# Patient Record
Sex: Female | Born: 1995
Health system: Southern US, Community
[De-identification: ages and names within clinical notes are randomized; demographics above are authoritative.]

## PROBLEM LIST (undated history)

## (undated) DIAGNOSIS — F419 Anxiety disorder, unspecified: Secondary | ICD-10-CM

## (undated) DIAGNOSIS — F32A Depression, unspecified: Secondary | ICD-10-CM

## (undated) DIAGNOSIS — R51 Headache: Secondary | ICD-10-CM

## (undated) DIAGNOSIS — J45909 Unspecified asthma, uncomplicated: Secondary | ICD-10-CM

## (undated) DIAGNOSIS — E282 Polycystic ovarian syndrome: Secondary | ICD-10-CM

## (undated) DIAGNOSIS — R519 Headache, unspecified: Secondary | ICD-10-CM

## (undated) DIAGNOSIS — J309 Allergic rhinitis, unspecified: Secondary | ICD-10-CM

## (undated) DIAGNOSIS — E559 Vitamin D deficiency, unspecified: Secondary | ICD-10-CM

## (undated) HISTORY — DX: Unspecified asthma, uncomplicated: J45.909

## (undated) HISTORY — DX: Polycystic ovarian syndrome: E28.2

## (undated) HISTORY — DX: Headache, unspecified: R51.9

## (undated) HISTORY — DX: Vitamin D deficiency, unspecified: E55.9

## (undated) HISTORY — DX: Depression, unspecified: F32.A

## (undated) HISTORY — DX: Allergic rhinitis, unspecified: J30.9

## (undated) HISTORY — PX: WISDOM TOOTH EXTRACTION: SHX21

## (undated) HISTORY — PX: NO PAST SURGERIES: SHX2092

## (undated) HISTORY — DX: Anxiety disorder, unspecified: F41.9

## (undated) HISTORY — DX: Headache: R51

---
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
## 2017-01-10 ENCOUNTER — Ambulatory Visit: Payer: Medicaid Other | Attending: Pediatrics | Admitting: Physical Therapy

## 2017-01-10 ENCOUNTER — Encounter: Payer: Self-pay | Admitting: Physical Therapy

## 2017-01-10 DIAGNOSIS — G8929 Other chronic pain: Secondary | ICD-10-CM | POA: Diagnosis present

## 2017-01-10 DIAGNOSIS — M25361 Other instability, right knee: Secondary | ICD-10-CM | POA: Diagnosis present

## 2017-01-10 DIAGNOSIS — M6281 Muscle weakness (generalized): Secondary | ICD-10-CM | POA: Diagnosis present

## 2017-01-10 DIAGNOSIS — M25561 Pain in right knee: Secondary | ICD-10-CM | POA: Diagnosis present

## 2017-01-10 NOTE — Therapy (Addendum)
[place], [place], [address] Phone: [phone]   Fax:  [phone]  Physical Therapy Evaluation  Patient Details  Name: Kristin Blanchard MRN: [id number] Date of Birth: [birth year] Referring Provider: Jonathon Resides, FNP   Encounter Date: 01/10/2017      PT End of Session - 01/10/17 1315    Visit Number 1   Number of Visits 9   Date for PT Re-Evaluation 03/07/17   PT Start Time 3267   PT Stop Time 1227   PT Time Calculation (min) 42 min   Activity Tolerance Patient tolerated treatment well   Behavior During Therapy [facility] for tasks assessed/performed      Past Medical History:  Diagnosis Date  . Allergic rhinitis   . Asthma     History reviewed. No pertinent surgical history.  There were no vitals filed for this visit.       Subjective Assessment - 01/10/17 1144    Subjective She reports a couple of months ago she felt her knee popped out of place.  It has done that 2 times.  Pain only the first time.  Once it happens, she has numbness below the knee. She often feels like her knee may pop out of place.  Denies swelling and weakness or difficulty walking or climbing stairs.  It happens randomly. Not interfering with activity. Has been doing a couple exercises her peeidatirican gave her.  Wears brace daily when she remembers (sleeve)    Pertinent History none    Limitations Other (comment)  Squatting down to pick up things from the floor, getting out of the car    Diagnostic tests XR neg    Patient Stated Goals Pt would like her knee to be better.     Currently in Pain? No/denies   Pain Location Knee   Pain Orientation Right   Pain Descriptors / Indicators Numbness   Pain Type Chronic pain   Pain Onset More than a month ago   Pain Frequency Several days a week   Aggravating Factors  random things, but squatting   Pain Relieving Factors no pain, numbness resolves with time.  Brace makes her feel  more stable.    Effect of Pain on Daily Activities lacks confidence , nervous about it dislocating again             [facility] PT Assessment - 01/10/17 0001      Assessment   Medical Diagnosis Patellar malalignment Rt. LE    Referring Provider Jonathon Resides, FNP    Onset Date/Surgical Date --  3 mos   Prior Therapy no      Precautions   Precautions None   Precaution Comments wears a PSO most days of the week      Balance Screen   Has the patient fallen in the past 6 months No     [place] residence   Living Arrangements Parent;Other relatives   Additional Comments plans to move into the dorms at [employer] with siblings, not very active, no time for fun      Cognition   Overall Cognitive Status Within Functional Limits for tasks assessed     Observation/Other Assessments   Focus on Therapeutic Outcomes (FOTO)  31%     Sensation   Light Touch Impaired by gross assessment   Additional Comments  reports entire lower leg goes numb after a dislocation of the patella      Functional Tests   Functional tests Squat;Single leg stance     Squat   Comments poor form , heels up     Single Leg Stance   Comments decr Rt. LE , mild Trendelenburg     Posture/Postural Control   Posture Comments slight genu recurvatum and valgus     AROM   Right Knee Extension 2   Right Knee Flexion 126   Left Knee Extension 0   Left Knee Flexion 130     Strength   Right Hip Flexion 4-/5   Right Hip ABduction 3/5   Left Hip Flexion 4/5   Left Hip ABduction 4/5   Right Knee Flexion 4/5   Right Knee Extension 4-/5   Left Knee Flexion 5/5   Left Knee Extension 5/5     Palpation   Patella mobility no pain    Palpation comment non tender joint line , no crepitus      Special Tests   Knee Special tests  Patellofemoral Grind Test (Clarke's Sign)     Patellofemoral  Grind test (Clark's Sign)   Findings Negative   Side  Right   Comments mild discomfort and with quad sets without compression     Ambulation/Gait   Gait Pattern Lateral trunk lean to right                  PT Education - 01/10/17 1312    Education provided Yes   Education Details PT/POC, HEP, explanation of condition, patellar tracking, anatomy   Person(s) Educated Patient   Methods Explanation   Comprehension Verbalized understanding;Returned demonstration          PT Short Term Goals - 01/10/17 1324      PT SHORT TERM GOAL #1   Title Pt will be I with HEP by first visit    Baseline given on eval    Time 2   Period Weeks   Status New   Target Date 01/21/17     PT SHORT TERM GOAL #2   Title Pt will be able to improve SLS on Rt. LE to 30 sec (equal to that of L )    Baseline Rt. leg 15 sec more effort needed   Time 4   Period Weeks   Status New           PT Long Term Goals - 01/10/17 1325      PT LONG TERM GOAL #1   Title Pt will be able to go without brace and report no instability most days of the week with normal ADLs   Baseline wears it most of the time for ADLs   Time 8   Period Weeks   Status New   Target Date 03/07/17     PT LONG TERM GOAL #2   Title Pt will be able to demo strength in Rt. LE to 4+/5 or more in abduction (hip) and knee ext/flexion to support gait and normal mechanics    Baseline 4-/5 to 4/5    Time 8   Period Weeks   Status New   Target Date 03/07/17     PT LONG TERM GOAL #3   Title Pt will be able to squat without discomfort for retrieving items off the floor, exercise.    Baseline uncomfortable, avoids this activity    Time 8   Period Weeks   Status New  Target Date 03/07/17                Plan - 01/10/17 1316    Clinical Impression Statement Pt presents for low complexity eval of ongoing knee instability which interferes with mobility.  She has weakness in Rt. hip abd, bilateral glutes and Rt. quads.   She has min to no pain most of the time, but when she squats or makes a sudden or random move her patella subluxes.  When is has dislocated she has numbness in her entire lower leg/foot which eventually resolves.  She would like to prevent further injury, get stronger and be able to return to school with more confidence in her Rt. knee. She may be able to come back on Fridays to do her therapy, but may also consider doing PT near [facility] She was given extensive HEP in the meantime for her to work on to maximize strength.    Clinical Presentation Stable   Clinical Decision Making Low   Rehab Potential Excellent   PT Frequency 1x / week   PT Duration 8 weeks   PT Next Visit Plan did tape help? check full HEP and cont VMO strength, core    PT Home Exercise Plan open chain hip and knee strength, supine hamstring and ITB stretching    Consulted and Agree with Plan of Care Patient      Patient will benefit from skilled therapeutic intervention in order to improve the following deficits and impairments:  Pain, Postural dysfunction, Decreased strength, Decreased endurance, Decreased balance, Obesity  Visit Diagnosis: Chronic pain of right knee  Muscle weakness (generalized)  Patellar instability of right knee     Problem List Patient Active Problem List   Diagnosis Date Noted  . Patellar pain, right 12/10/2016  . Irregular menses 05/07/2016  . Nonintractable headache 05/07/2016  . Vitamin D deficiency 08/24/2015  . Obesity 10/22/2014  . Asthma, chronic 02/09/2014  . Environmental allergies 02/09/2014    Azhar Knope 01/10/2017, 2:48 PM  [facility] [address] [place], [place], [address] Phone: [phone]   Fax:  [phone]  Name: Kristin Blanchard MRN: [id number] Date of Birth: Mar 29, 1996   Raeford Razor, PT 01/10/17 2:49 PM Phone: [phone] Fax: [phone]  PHYSICAL THERAPY DISCHARGE SUMMARY  Visits from Start of  Care: 1  Current functional level related to goals / functional outcomes: See above for most recent   Remaining deficits: Unknown did not return    Education / Equipment: HEP, LE alignment  Plan: Patient agrees to discharge.  Patient goals were not met. Patient is being discharged due to not returning since the last visit.  ?????     Raeford Razor, PT 03/01/17 7:47 AM Phone: [phone] Fax: [phone]

## 2017-01-10 NOTE — Patient Instructions (Signed)
Access Code: [email or web address]  URL: [email or web address]  Date: 01/10/2017  Prepared by: Claris GowerVince Carlson   Exercises  Seated Long Arc Quad - 10 reps - 3 sets - 1x daily - 7x weekly  Supine Active Straight Leg Raise - 10 reps - 3 sets - 1x daily - 7x weekly  Supine Short Arc Quad - 10 reps - 3 sets - 1x daily - 7x weekly  Sidelying Hip Adduction - 10 reps - 3 sets - 1x daily - 7x weekly  Sidelying Diagonal Hip Abduction - 10 reps - 3 sets - 1x daily - 7x weekly  Seated Quad Set - 10 reps - 3 sets - 1x daily - 7x weekly  Supine Hamstring Stretch with Strap - 3 reps - 1 sets - 30 hold - 1x daily - 7x weekly  Supine ITB Stretch with Strap - 3 reps - 1 sets - 30 hold - 1x daily - 7x weekly

## 2017-01-11 ENCOUNTER — Ambulatory Visit: Payer: Medicaid Other | Admitting: Physical Therapy

## 2017-01-21 ENCOUNTER — Ambulatory Visit (INDEPENDENT_AMBULATORY_CARE_PROVIDER_SITE_OTHER): Payer: Medicaid Other | Admitting: Clinical

## 2017-01-21 DIAGNOSIS — F4322 Adjustment disorder with anxiety: Secondary | ICD-10-CM

## 2017-01-21 NOTE — BH Specialist Note (Signed)
Integrated Behavioral Health Follow up Visit  MRN: [id number] Name: Kristin Blanchard   Session Start time: 9:01 AM Session End time: 1001 AM  Total time: 1 hour [facility] Visits: 6th  Type of Service: Integrated Behavioral Health- Individual/Family Interpretor:No. Interpretor Name and Language: n/a   SUBJECTIVE: Kristin Blanchard is a 21 y.o. female accompanied by patient. Patient was referred by C. Millican for social anxiety.  Patient reports the following symptoms/concerns:   1 Still feeling anxious around public speaking which has kept her from attending classes in college in the past year 2. Difficulty sleeping - Has been taking melatonin recently 3. Reported today increased anxiety due to death of a friend in her community  Duration of problem: Months; Severity of problem: moderate  OBJECTIVE: Mood: Euthymic and Affect: Appropriate Risk of harm to self or others: No plan to harm self or others   LIFE CONTEXT: Family and Social: Lives with parents School/Work: Going back to college to [employer], classes starting this Wednesday   Self-Care: Enjoys being with her friends in [place] Life Changes: Adjusting to college, adjusting to recent death of a friend  GOALS ADDRESSED: Patient will reduce symptoms of: anxiety around public speaking and increase knowledge and/or ability of: coping skills and stress reduction.  INTERVENTIONS: Assessed sleep hygiene   Brief CBT  Standardized Assessments completed: None at this time   ASSESSMENT: Kristin Blanchard presented to be anxious today due to the death of a friend over the weekend.  She plans on transitioning to college this week.  She's been taking Metformin once a day for PCOS.  Pt reported ongoing concerns with speaking in front of others and difficulty sleeping.  Although she's decreased her caffeine intake the past week, her anxiety has increased and has disrupted her sleep.  Kristin Blanchard actively participated in challenging unhelpful thoughts  causing her anxiety.   PLAN: 1. Follow up with behavioral health clinician on : 03/07/17 Joint visit with FNP & RD 2. Behavioral recommendations:   * Practice challenging unhelpful thoughts & replacing it with more positive ones. * Call [facility] about what they need to transfer care to them for physical therapy (Classes start Wed) * Follow up with student services for counseling on campus  3. Referral(s): Integrated Behavioral Health Services (In Clinic) 4. "From scale of 1-10, how likely are you to follow plan?": She was agreeable to plan above   Plan for next visit: Review results of Connor's Self-Report Ensure adequate support system in place at college Practice cognitive coping skills  Gordy Savers, LCSW

## 2017-01-25 ENCOUNTER — Ambulatory Visit: Payer: Medicaid Other | Admitting: Physical Therapy

## 2017-02-01 ENCOUNTER — Ambulatory Visit: Payer: Medicaid Other | Admitting: Internal Medicine

## 2017-02-08 ENCOUNTER — Encounter: Payer: Medicaid Other | Admitting: Physical Therapy

## 2017-02-15 ENCOUNTER — Ambulatory Visit: Payer: Medicaid Other | Attending: Pediatrics | Admitting: Physical Therapy

## 2017-02-22 ENCOUNTER — Encounter: Payer: Self-pay | Admitting: Physician Assistant

## 2017-02-22 ENCOUNTER — Ambulatory Visit (INDEPENDENT_AMBULATORY_CARE_PROVIDER_SITE_OTHER): Payer: Medicaid Other | Admitting: Physician Assistant

## 2017-02-22 VITALS — BP 104/72 | HR 68 | Ht 60.0 in | Wt 210.4 lb

## 2017-02-22 DIAGNOSIS — R112 Nausea with vomiting, unspecified: Secondary | ICD-10-CM | POA: Diagnosis not present

## 2017-02-22 MED ORDER — ONDANSETRON 4 MG PO TBDP: ORAL_TABLET | ORAL | 1 refills | Status: DC

## 2017-02-22 NOTE — Progress Notes (Signed)
Reviewed and agree with initial management plan.  Bassel Gaskill T. Royelle Hinchman, MD FACG 

## 2017-02-22 NOTE — Progress Notes (Signed)
Subjective:    Patient ID: Kristin Blanchard, female    DOB: 12-21-1995, 21 y.o.   MRN: [id number]  HPI Kristin Blanchard is a 21 year old female, new to GI today referred by Mills Koller NP/[facility]. Patient has history of asthma, obesity and polycystic ovarian syndrome as well as vitamin D deficiency. She comes in today with complaint of intermittent nausea with vomiting which is been going on over the past 10 years or so. She has not had any prior GI evaluation that she can recall. She recently was given a trial of Protonix with no change in symptoms. Patient states she remembers having symptoms as young as age 13. She says when she was a child she would have symptoms almost every morning immediately on waking up. She would feel slightly nauseated and that she tried to eat she would vomit and then feel fine. She would generally avoid eating for a few hours, this sensation would subside and she would be fine the rest of the day.  Over the past few years she says that she has symptoms less often.. She may go for a couple of weeks without any episodes and then may have symptoms for one or 2 days in a row. She says it still present early in the a.m. when she wakes up. If she feels nauseated,she waits for a few hours before eating anything and then she'll be fine. If she tries to eat while she feels slightly nauseated she'll vomit and then feel fine. She does not feel that symptoms are directly stress related. She has no complaints of associated abdominal pain. She feels fine in between these episodes and generally feels fine each day after she vomits even if she has an episode. Appetite fine, no issues with her bowels, no heartburn or indigestion though  it sounds like she may occasionally have some sour brash. Weight has been stable.  Review of Systems Pertinent positive and negative review of systems were noted in the above HPI section.  All other review of systems was otherwise  negative.  Outpatient Encounter Prescriptions as of 02/22/2017  Medication Sig  . albuterol (PROVENTIL HFA;VENTOLIN HFA) 108 (90 Base) MCG/ACT inhaler Inhale 2 puffs into the lungs every 4 hours as needed to treat wheezes, cough, shortness of breath  . cetirizine (ZYRTEC) 10 MG tablet Take 1 tablet (10 mg total) by mouth daily.  . metFORMIN (GLUCOPHAGE) 500 MG tablet Take by mouth 2 (two) times daily with a meal.  . ondansetron (ZOFRAN ODT) 4 MG disintegrating tablet Take 1 tab, dissolve on the tongue in the morning for nausea.  . [DISCONTINUED] pantoprazole (PROTONIX) 40 MG tablet Take 1 tablet (40 mg total) by mouth daily. (Patient not taking: Reported on 02/22/2017)   No facility-administered encounter medications on file as of 02/22/2017.    No Known Allergies Patient Active Problem List   Diagnosis Date Noted  . Patellar pain, right 12/10/2016  . Irregular menses 05/07/2016  . Nonintractable headache 05/07/2016  . Vitamin D deficiency 08/24/2015  . Obesity 10/22/2014  . Asthma, chronic 02/09/2014  . Environmental allergies 02/09/2014   Social History   Social History  . Marital status: Single    Spouse name: N/A  . Number of children: 0  . Years of education: N/A   Occupational History  . student    Social History Main Topics  . Smoking status: Never Smoker  . Smokeless tobacco: Never Used  . Alcohol use No  . Drug use: No  .  Sexual activity: No   Other Topics Concern  . Not on file   Social History Narrative  . No narrative on file    Ms. Petkus family history includes Diabetes in her father, paternal aunt, and paternal uncle; Hypertension in her father; Kidney disease in her maternal aunt; Other in her mother.      Objective:    Vitals:   02/22/17 1107  BP: 104/72  Pulse: 68    Physical Exam  well-developed young female in no acute distress, blood pressure 104/72, pulse 68, height 5 foot, weight 210, BMI 41.0. HEENT; nontraumatic normocephalic EOMI  PERRLA sclera anicteric, Cardiovascular; regular rate and rhythm with S1-S2 no murmur or gallop, Pulmonary ;clear bilaterally, Abdomen; obese soft and tender nondistended bowel sounds are active there is no palpable mass or hepatosplenomegaly, Rectal ;exam not done, Extremities ;no clubbing cyanosis or edema skin warm and dry, Neuropsych; mood and affect appropriate       Assessment & Plan:   #87 21 year old female with at least 10 year history of intermittent early morning mild nausea followed by 1 episode of emesis if she attempts to eat while feeling queasy. Symptoms generally subside within 1-2 hours of awakening, or immediately after vomiting. Patient states that these episodes  Used  to occur every day and as she has gotten older seem to be occurring less frequently and may go weeks at a time without symptoms. Etiology of symptoms is not clear, suspect functional and doubt any significant underlying pathology. We will rule out gallbladder disease though doubt, rule out GERD, chronic gastropathy. #2 morbid obesity with BMI 41 #3 anxiety #4 asthma #5 polycystic ovarian syndrome  Plan; Will give patient a trial of Zofran 4 mg ODT to be used on a when necessary basis at onset of nausea Schedule upper abdominal ultrasound  schedule barium swallow/upper GI Patient will be established with Dr. Russella Dar, and we will arrange follow-up depending on findings of above.  Naia Ruff S Teesha Ohm PA-C 02/22/2017   Cc: Christianne Dolin, NP

## 2017-02-22 NOTE — Patient Instructions (Addendum)
We sent a prescription for zofran ODT for nausea to your pharmacy. You have been scheduled for an abdominal ultrasound at [facility] (1st floor of hospital) on Friday 10-5  at 9:00  am. Please arrive at 8:45 am minutes  to your appointment for registration. Make certain not to have anything to eat or drink after midnight  prior to your appointment. Should you need to reschedule your appointment, please contact radiology at [phone]. This test typically takes about 30 minutes to perform.  The Barium swallow test and the Upper Gi test would be done at 10:30, arrive at 10:15 am.   __________________________________________________________________ A barium swallow is an examination that concentrates on views of the esophagus. This tends to be a double contrast exam (barium and two liquids which, when combined, create a gas to distend the wall of the oesophagus) or single contrast (non-ionic iodine based). The study is usually tailored to your symptoms so a good history is essential. Attention is paid during the study to the form, structure and configuration of the esophagus, looking for functional disorders (such as aspiration, dysphagia, achalasia, motility and reflux) EXAMINATION You may be asked to change into a gown, depending on the type of swallow being performed. A radiologist and radiographer will perform the procedure. The radiologist will advise you of the type of contrast selected for your procedure and direct you during the exam. You will be asked to stand, sit or lie in several different positions and to hold a small amount of fluid in your mouth before being asked to swallow while the imaging is performed .In some instances you may be asked to swallow barium coated marshmallows to assess the motility of a solid food bolus. The exam can be recorded as a digital or video fluoroscopy procedure. POST PROCEDURE It will take 1-2 days for the barium to pass through your system. To  facilitate this, it is important, unless otherwise directed, to increase your fluids for the next 24-48hrs and to resume your normal diet.  This test typically takes about 30 minutes to perform. __________________________________________________________________________________  __________________________________________________________________ A barium swallow is an examination that concentrates on views of the esophagus. This tends to be a double contrast exam (barium and two liquids which, when combined, create a gas to distend the wall of the oesophagus) or single contrast (non-ionic iodine based). The study is usually tailored to your symptoms so a good history is essential. Attention is paid during the study to the form, structure and configuration of the esophagus, looking for functional disorders (such as aspiration, dysphagia, achalasia, motility and reflux) EXAMINATION You may be asked to change into a gown, depending on the type of swallow being performed. A radiologist and radiographer will perform the procedure. The radiologist will advise you of the type of contrast selected for your procedure and direct you during the exam. You will be asked to stand, sit or lie in several different positions and to hold a small amount of fluid in your mouth before being asked to swallow while the imaging is performed .In some instances you may be asked to swallow barium coated marshmallows to assess the motility of a solid food bolus. The exam can be recorded as a digital or video fluoroscopy procedure. POST PROCEDURE It will take 1-2 days for the barium to pass through your system. To facilitate this, it is important, unless otherwise directed, to increase your fluids for the next 24-48hrs and to resume your normal diet.  This test typically takes about  30 minutes to  perform. _______________________________________________________________________ ________________________________________________________________ An upper GI series uses x rays to help diagnose problems of the upper GI tract, which includes the esophagus, stomach, and duodenum. The duodenum is the first part of the small intestine. An upper GI series is conducted by a radiology technologist or a radiologist-a doctor who specializes in x-ray imaging-at a hospital or outpatient center. While sitting or standing in front of an x-ray machine, the patient drinks barium liquid, which is often white and has a chalky consistency and taste. The barium liquid coats the lining of the upper GI tract and makes signs of disease show up more clearly on x rays. X-ray video, called fluoroscopy, is used to view the barium liquid moving through the esophagus, stomach, and duodenum. Additional x rays and fluoroscopy are performed while the patient lies on an x-ray table. To fully coat the upper GI tract with barium liquid, the technologist or radiologist may press on the abdomen or ask the patient to change position. Patients hold still in various positions, allowing the technologist or radiologist to take x rays of the upper GI tract at different angles. If a technologist conducts the upper GI series, a radiologist will later examine the images to look for problems.  This test typically takes about 1 hour to complete. __________________________________________________________________

## 2017-03-01 ENCOUNTER — Ambulatory Visit: Payer: Self-pay | Admitting: Family

## 2017-03-01 ENCOUNTER — Ambulatory Visit (HOSPITAL_COMMUNITY)
Admission: RE | Admit: 2017-03-01 | Discharge: 2017-03-01 | Disposition: A | Discharge status: Home / Self Care | Payer: Medicaid Other | Source: Ambulatory Visit | Attending: Physician Assistant | Admitting: Physician Assistant

## 2017-03-01 DIAGNOSIS — R112 Nausea with vomiting, unspecified: Secondary | ICD-10-CM

## 2017-03-07 ENCOUNTER — Encounter: Payer: Self-pay | Admitting: Family

## 2017-03-07 ENCOUNTER — Ambulatory Visit (INDEPENDENT_AMBULATORY_CARE_PROVIDER_SITE_OTHER): Payer: BLUE CROSS/BLUE SHIELD | Admitting: Family

## 2017-03-07 ENCOUNTER — Encounter: Payer: Medicaid Other | Attending: Pediatrics | Admitting: *Deleted

## 2017-03-07 ENCOUNTER — Ambulatory Visit (INDEPENDENT_AMBULATORY_CARE_PROVIDER_SITE_OTHER): Payer: BLUE CROSS/BLUE SHIELD | Admitting: Clinical

## 2017-03-07 VITALS — BP 112/77 | HR 73 | Ht 59.65 in | Wt 209.2 lb

## 2017-03-07 DIAGNOSIS — F4322 Adjustment disorder with anxiety: Secondary | ICD-10-CM | POA: Diagnosis not present

## 2017-03-07 DIAGNOSIS — E282 Polycystic ovarian syndrome: Secondary | ICD-10-CM | POA: Diagnosis not present

## 2017-03-07 DIAGNOSIS — F4323 Adjustment disorder with mixed anxiety and depressed mood: Secondary | ICD-10-CM

## 2017-03-07 DIAGNOSIS — E559 Vitamin D deficiency, unspecified: Secondary | ICD-10-CM

## 2017-03-07 DIAGNOSIS — Z713 Dietary counseling and surveillance: Secondary | ICD-10-CM | POA: Diagnosis not present

## 2017-03-07 MED ORDER — FLUOXETINE HCL 20 MG PO CAPS
20.0000 mg | ORAL_CAPSULE | Freq: Every day | ORAL | 0 refills | Status: DC
Start: 2017-03-07 — End: 2017-04-05

## 2017-03-07 MED ORDER — HYDROXYZINE PAMOATE 25 MG PO CAPS: 25.0000 mg | ORAL_CAPSULE | Freq: Three times a day (TID) | ORAL | 0 refills | Status: DC | PRN

## 2017-03-07 NOTE — Patient Instructions (Signed)
Start taking Prozac 20 mg dailly in the morning.  Hydroxzyine 25 mg TID as needed.  Return in 3 weeks for med change.

## 2017-03-07 NOTE — Progress Notes (Signed)
THIS RECORD MAY CONTAIN CONFIDENTIAL INFORMATION THAT SHOULD NOT BE RELEASED WITHOUT REVIEW OF THE SERVICE PROVIDER.  Adolescent Medicine Consultation Follow-Up Visit Kristin Blanchard  is a 21 y.o. female referred by Tilman Neat, MD here today for follow-up regarding PCOS.    Last seen in Adolescent Medicine Clinic on 12/31/16 for same.  Plan at last visit included protonix for GERD-like symptoms; zyrtec for allergies/sleep, Neuro referral, metformin cont'd for PCOS.   Pertinent Labs? No Growth Chart Viewed? no   History was provided by the patient.  Interpreter? no  PCP Confirmed?  no  My Chart Activated?   yes    Chief Complaint  Patient presents with  . Follow-up    not sleeping well--needs results    HPI:    -upcoming GI procedure. Notes reviewed and confirmed with pt.  -symptoms persists  -having anxiety; no ready to pursue medication.  -school stressors, sleep disturbed.  -safe to herself.   Review of Systems  Constitutional: Negative for malaise/fatigue.  Eyes: Negative for double vision.  Respiratory: Negative for shortness of breath.   Cardiovascular: Negative for chest pain and palpitations.  Gastrointestinal: Positive for nausea. Negative for abdominal pain, constipation, diarrhea and vomiting.  Genitourinary: Negative for dysuria.  Musculoskeletal: Negative for joint pain and myalgias.  Skin: Negative for rash.  Neurological: Positive for headaches. Negative for dizziness.  Endo/Heme/Allergies: Does not bruise/bleed easily.   PHQ-SADS SCORE ONLY 03/15/2017 12/31/2016  PHQ-15 3 5   GAD-7 7 3   PHQ-9 7 6   Suicidal Ideation No No  Comment somewhat difficult  "Somewhat difficult"   PHQ-SADS SCORE ONLY 11/29/2016  PHQ-15 5  GAD-7 6  PHQ-9 8  Suicidal Ideation No  Comment "Very difficult" to complete ADL, no anxiety attacks    Patient's last menstrual period was 03/01/2017. No Known Allergies Outpatient Medications Prior to Visit  Medication Sig  Dispense Refill  . albuterol (PROVENTIL HFA;VENTOLIN HFA) 108 (90 Base) MCG/ACT inhaler Inhale 2 puffs into the lungs every 4 hours as needed to treat wheezes, cough, shortness of breath 1 Inhaler 1  . cetirizine (ZYRTEC) 10 MG tablet Take 1 tablet (10 mg total) by mouth daily. 30 tablet 6  . ondansetron (ZOFRAN ODT) 4 MG disintegrating tablet Take 1 tab, dissolve on the tongue in the morning for nausea. 30 tablet 1  . metFORMIN (GLUCOPHAGE) 500 MG tablet Take by mouth 2 (two) times daily with a meal.     No facility-administered medications prior to visit.      Patient Active Problem List   Diagnosis Date Noted  . Patellar pain, right 12/10/2016  . Irregular menses 05/07/2016  . Nonintractable headache 05/07/2016  . Vitamin D deficiency 08/24/2015  . Obesity 10/22/2014  . Asthma, chronic 02/09/2014  . Environmental allergies 02/09/2014    Physical Exam:  Vitals:   03/07/17 1052  BP: 112/77  Pulse: 73  Weight: 209 lb 3.2 oz (94.9 kg)  Height: 4' 11.65" (1.515 m)   BP 112/77 (BP Location: Right Arm, Patient Position: Sitting, Cuff Size: Large)   Pulse 73   Ht 4' 11.65" (1.515 m)   Wt 209 lb 3.2 oz (94.9 kg)   LMP 03/01/2017   BMI 41.34 kg/m  Body mass index: body mass index is 41.34 kg/m. Blood pressure percentiles are 63 % systolic and 93 % diastolic based on the August 2017 AAP Clinical Practice Guideline. Blood pressure percentile targets: 90: 123/76, 95: 127/79, 95 + 12 mmHg: 139/91.  Wt Readings from Last 3 Encounters:  03/07/17  209 lb 3.2 oz (94.9 kg) (98 %, Z= 2.08)*  02/22/17 210 lb 6 oz (95.4 kg) (98 %, Z= 2.09)*  12/31/16 208 lb (94.3 kg) (98 %, Z= 2.06)*   * Growth percentiles are based on CDC 2-20 Years data.    Physical Exam  Constitutional: She is oriented to person, place, and time. She appears well-developed. No distress.  HENT:  Head: Normocephalic and atraumatic.  Eyes: Pupils are equal, round, and reactive to light. EOM are normal. No scleral  icterus.  Neck: Normal range of motion. Neck supple. No thyromegaly present.  Cardiovascular: Normal rate, regular rhythm, normal heart sounds and intact distal pulses.   No murmur heard. Pulmonary/Chest: Effort normal and breath sounds normal.  Abdominal: Soft.  Musculoskeletal: Normal range of motion. She exhibits no edema.  Lymphadenopathy:    She has no cervical adenopathy.  Neurological: She is alert and oriented to person, place, and time. No cranial nerve deficit.  Skin: Skin is warm and dry. No rash noted.  Psychiatric: She has a normal mood and affect. Her behavior is normal. Judgment and thought content normal.    Assessment/Plan: 1. Adjustment disorder with mixed anxiety and depressed mood -start prozac 20 mg  -start hydroxyzine 25 TID as needed for breakthrough anxiety or sleep  -return in 3 weeks for med check  -BBW and return precautions improved  2. Vitamin D deficiency -needs redraw at next OV   BH screenings: phqsads reviewed and indicated anxiety. Screens discussed with patient and parent and adjustments to plan made accordingly.   Follow-up:  Return in about 3 weeks (around 03/28/2017) for with Christianne Dolin, FNP-C, medication follow-up.   Medical decision-making:  >25 minutes spent face to face with patient with more than 50% of appointment spent reviewing medication management, AEs, BBW, and return precautions. Patient would benefit from therapy; continue to encourage.

## 2017-03-07 NOTE — Progress Notes (Signed)
Medical Nutrition Therapy:  Appt start time: 1100 end time:  1030   Assessment:  Primary concerns today: Kristin Blanchard is here for nutrition counseling pertaining to PCOS.   Doing ok.  College is challenging.  Stomach is still upset..  Saw GI.  Was prescribed Zofran but left it at home and hasn't started taking it.  Still waiting results of abdominal ultrasound.  barium swallow was fine.   States she has been trying to eat more.  Gets breakfast at cafeteria.  Eats a late lunch and dinner on mondays and wednesdays.  Tuesdays breafast gets skipped and has lunch and dinner.  Has snacks when she gets hungry.  It's hard to find food she wants at the cafeteria.  Is getting tired of eating the same thing over and over.   Stopped taking Metformin because it was hard to eat enough food to stop GI distress.  Wants to switch to Ovasitol.    Not sleeping well.  More headaches.  Was prescribed contacts, but doesn't wear them every day.  . Takes naps.  Wonders if she is depressed?  .  Realizes her friend group is not for her    Preferred Learning Style:  No preference indicated   Learning Readiness:  Change in progress  DIETARY INTAKE:  24-hr recall:  B: yogurt parfait with fruit and granola.  Water Water throughout  Coffee [place] pesto panino from [employer].  Ate half 1/2 stuffed pepper Traditional sudanese dish with beef, flatbread Mango smoothie with tea Beverages: coffee, water  Usual physical activity:walks to class    Nutritional Diagnosis:  [place]-2.1 Inpaired nutrition utilization As related to carbohyrate.  As evidenced by PCOS.    Intervention:  Nutrition counseling provided.  Discussed 3 meals/day with adequate protein, wear contacts, take ovasitol, recommended omega-3.  Exercise, if able, fruits and veggies.  Discouraged dieting and pursuit of weight loss  Teaching Method Utilized:  Auditory   Handouts given during visit include:  PCOS Tips  What is inositol?  Barriers to  learning/adherence to lifestyle change: family  Demonstrated degree of understanding via:  Teach Back   Monitoring/Evaluation:  Dietary intake, exercise, labs, and body weight in 2 month(s).

## 2017-03-07 NOTE — BH Specialist Note (Signed)
Integrated Behavioral Health Follow up Visit  MRN: [id number] Name: Laquita Harlan   Session Start time: 1020 Session End time: 1045  Total time: 25 min [facility] Visits: 7th  Type of Service: Integrated Behavioral Health- Individual/Family Interpretor:No. Interpretor Name and Language: n/a   SUBJECTIVE: Kristin Blanchard is a 21 y.o. female accompanied by patient. Patient was referred by C. Millican for social anxiety.  Patient reports the following symptoms/concerns:   1. Anxious about meeting new people 2. Difficulty sleeping   Duration of problem: Months; Severity of problem: moderate  OBJECTIVE: Mood: Euthymic and Affect: Appropriate Risk of harm to self or others: No plan to harm self or others   LIFE CONTEXT: Family and Social: Lives with parents School/Work: 2nd year at [employer] Self-Care: Enjoys being with her friends in [place] Life Changes: Adjusting to college,   GOALS ADDRESSED: Patient will reduce symptoms of: anxiety around meeting new people and increase knowledge and/or ability of: coping skills and stress reduction.  INTERVENTIONS: Assessed sleep hygiene   Brief CBT  Standardized Assessments completed: None at this time (reviewed results of Connor's self-report that were not significant for ADHD symptoms)   ASSESSMENT: Kamariyah presented to be stressed from exams at school last week and interacting with peers.  Dewana actively participated in challenging unhelpful thoughts causing her anxiety.  Shikita was able to identify her accomplishments in meeting new people the past few weeks.   PLAN: 1. Follow up with behavioral health clinician on : As needed during visits with medical provider 2. Behavioral recommendations:   * Practice challenging unhelpful thoughts & replacing it with more positive ones.  * Follow up with student services for counseling on campus as needed  3. Referral(s): Integrated [employer] (In Clinic) 4. "From scale of  1-10, how likely are you to follow plan?": She was agreeable to plan above    Gordy Savers, LCSW

## 2017-03-08 ENCOUNTER — Other Ambulatory Visit (HOSPITAL_COMMUNITY): Payer: Medicaid Other

## 2017-03-08 ENCOUNTER — Ambulatory Visit (HOSPITAL_COMMUNITY): Payer: Medicaid Other

## 2017-03-10 ENCOUNTER — Encounter: Payer: Self-pay | Admitting: Family

## 2017-03-22 ENCOUNTER — Encounter: Payer: Self-pay | Admitting: Diagnostic Neuroimaging

## 2017-03-22 ENCOUNTER — Ambulatory Visit (INDEPENDENT_AMBULATORY_CARE_PROVIDER_SITE_OTHER): Payer: BLUE CROSS/BLUE SHIELD | Admitting: Diagnostic Neuroimaging

## 2017-03-22 VITALS — BP 111/82 | HR 68 | Ht 60.0 in | Wt 208.0 lb

## 2017-03-22 DIAGNOSIS — G43009 Migraine without aura, not intractable, without status migrainosus: Secondary | ICD-10-CM | POA: Diagnosis not present

## 2017-03-22 DIAGNOSIS — G4489 Other headache syndrome: Secondary | ICD-10-CM | POA: Diagnosis not present

## 2017-03-22 MED ORDER — TOPIRAMATE 50 MG PO TABS: 50.0000 mg | ORAL_TABLET | Freq: Two times a day (BID) | ORAL | 12 refills | Status: DC

## 2017-03-22 MED ORDER — RIZATRIPTAN BENZOATE 10 MG PO TBDP: 10.0000 mg | ORAL_TABLET | ORAL | 11 refills | Status: DC | PRN

## 2017-03-22 NOTE — Patient Instructions (Signed)
Thank you for coming to see [place] at [facility]. I hope we have been able to provide you high quality care today.  You may receive a patient satisfaction survey over the next few weeks. We would appreciate your feedback and comments so that we may continue to improve ourselves and the health of our patients.  - check MRI brain  - start topiramate '50mg'$  at bedtime; after 1 week increase to twice a day; drink plenty of water  - rizatriptan '10mg'$  as needed for breakthrough headache; may repeat x 1 after 2 hours; max 2 tabs per day or 8 per month  - To prevent or relieve headaches, try the following:   Cool Compress. Lie down and place a cool compress on your head.   Avoid headache triggers. If certain foods or odors seem to have triggered your migraines in the past, avoid them. A headache diary might help you identify triggers.   Include physical activity in your daily routine.   Manage stress. Find healthy ways to cope with the stressors, such as delegating tasks on your to-do list.   Practice relaxation techniques. Try deep breathing, yoga, massage and visualization.   Eat regularly. Eating regularly scheduled meals and maintaining a healthy diet might help prevent headaches. Also, drink plenty of fluids.   Follow a regular sleep schedule. Sleep deprivation might contribute to headaches  Consider biofeedback. With this mind-body technique, you learn to control certain bodily functions - such as muscle tension, heart rate and blood pressure - to prevent headaches or reduce headache pain.   ~~~~~~~~~~~~~~~~~~~~~~~~~~~~~~~~~~~~~~~~~~~~~~~~~~~~~~~~~~~~~~~~~  DR. Norah Fick'S GUIDE TO HAPPY AND HEALTHY LIVING These are some of my general health and wellness recommendations. Some of them may apply to you better than others. Please use common sense as you try these suggestions and feel free to ask me any questions.   ACTIVITY/FITNESS Mental, social, emotional and  physical stimulation are very important for brain and body health. Try learning a new activity (arts, music, language, sports, games).  Keep moving your body to the best of your abilities. You can do this at home, inside or outside, the park, community center, gym or anywhere you like. Consider a physical therapist or personal trainer to get started. Consider the app Sworkit. Fitness trackers such as smart-watches, smart-phones or Fitbits can help as well.   NUTRITION Eat more plants: colorful vegetables, nuts, seeds and berries.  Eat less sugar, salt, preservatives and processed foods.  Avoid toxins such as cigarettes and alcohol.  Drink water when you are thirsty. Warm water with a slice of lemon is an excellent morning drink to start the day.  Consider these websites for more information The Nutrition Source (https://www.henry-hernandez.biz/) Precision Nutrition (WindowBlog.ch)   RELAXATION Consider practicing mindfulness meditation or other relaxation techniques such as deep breathing, prayer, yoga, tai chi, massage. See website mindful.org or the apps Headspace or Calm to help get started.   SLEEP Try to get at least 7-8+ hours sleep per day. Regular exercise and reduced caffeine will help you sleep better. Practice good sleep hygeine techniques. See website sleep.org for more information.   PLANNING Prepare estate planning, living will, healthcare POA documents. Sometimes this is best planned with the help of an attorney. Theconversationproject.org and agingwithdignity.org are excellent resources.

## 2017-03-22 NOTE — Progress Notes (Signed)
[facility]  PATIENT: Kristin Blanchard DOB: 1995/10/15  REFERRING CLINICIAN: C hacker, NP HISTORY FROM: patient and chart  REASON FOR VISIT: new consult   HISTORICAL  CHIEF COMPLAINT:  Chief Complaint  Patient presents with  . Headache    rm 7, New Pt, "headaches for a long time, usually in my eyes; never seen a neurologist before"    HISTORY OF PRESENT ILLNESS:   21 year old female with history of asthma and PCO S, here for evaluation of headaches.  Patient has long history of headaches from second grade, with pain in her eyes, forehead, photophobia, nausea. She has bilateral pounding and sharp pain. Triggering factors include certain smells, especially things that remind her of her childhood. Headaches affect her quality of sleep. Patient having 1-4 headaches per week, lasting hours at a time. Patient has never officially been diagnosed with migraine headaches. No warning or aura symptoms. She takes over-the-counter medications such as Aleve without relief. Patient's mother has migraine headaches.    REVIEW OF SYSTEMS: Full 14 system review of systems performed and negative with exception of: Headache insomnia sleepiness anxiety allergies. Excessive daytime sleepiness.  ALLERGIES: No Known Allergies  HOME MEDICATIONS: Outpatient Medications Prior to Visit  Medication Sig Dispense Refill  . albuterol (PROVENTIL HFA;VENTOLIN HFA) 108 (90 Base) MCG/ACT inhaler Inhale 2 puffs into the lungs every 4 hours as needed to treat wheezes, cough, shortness of breath 1 Inhaler 1  . cetirizine (ZYRTEC) 10 MG tablet Take 1 tablet (10 mg total) by mouth daily. 30 tablet 6  . FLUoxetine (PROZAC) 20 MG capsule Take 1 capsule (20 mg total) by mouth daily. 30 capsule 0  . hydrOXYzine (VISTARIL) 25 MG capsule Take 1 capsule (25 mg total) by mouth 3 (three) times daily as needed. 30 capsule 0  . ondansetron (ZOFRAN ODT) 4 MG disintegrating tablet Take 1 tab, dissolve on the  tongue in the morning for nausea. 30 tablet 1  . metFORMIN (GLUCOPHAGE) 500 MG tablet Take by mouth 2 (two) times daily with a meal.     No facility-administered medications prior to visit.     PAST MEDICAL HISTORY: Past Medical History:  Diagnosis Date  . Allergic rhinitis   . Asthma   . Headache   . PCOS (polycystic ovarian syndrome)     PAST SURGICAL HISTORY: Past Surgical History:  Procedure Laterality Date  . NO PAST SURGERIES    . WISDOM TOOTH EXTRACTION      FAMILY HISTORY: Family History  Problem Relation Age of Onset  . Diabetes Father   . Hypertension Father   . Other Mother        hypoglycemia  . Diabetes Paternal Aunt   . Diabetes Paternal Uncle   . Kidney disease Maternal Aunt     SOCIAL HISTORY:  Social History   Social History  . Marital status: Single    Spouse name: N/A  . Number of children: 0  . Years of education: N/A   Occupational History  . student     [employer]   Social History Main Topics  . Smoking status: Never Smoker  . Smokeless tobacco: Never Used  . Alcohol use No  . Drug use: No  . Sexual activity: No   Other Topics Concern  . Not on file   Social History Narrative   03/22/17 [place], [employer], sophomore   Caffeine- coffee, 1 cup 3 x weekly     PHYSICAL EXAM  GENERAL EXAM/CONSTITUTIONAL: Vitals:  Vitals:  03/22/17 0834  BP: 111/82  Pulse: 68  Weight: 208 lb (94.3 kg)  Height: 5' (1.524 m)     Body mass index is 40.62 kg/m.  Visual Acuity Screening   Right eye Left eye Both eyes  Without correction:     With correction: 20/40 20/40   Comments: contacts    Patient is in no distress; well developed, nourished and groomed; neck is supple  CARDIOVASCULAR:  Examination of carotid arteries is normal; no carotid bruits  Regular rate and rhythm, no murmurs  Examination of peripheral vascular system by observation and palpation is normal  EYES:  Ophthalmoscopic exam of optic discs and  posterior segments is normal; no papilledema or hemorrhages  MUSCULOSKELETAL:  Gait, strength, tone, movements noted in Neurologic exam below  NEUROLOGIC: MENTAL STATUS:  No flowsheet data found.  awake, alert, oriented to person, place and time  recent and remote memory intact  normal attention and concentration  language fluent, comprehension intact, naming intact,   fund of knowledge appropriate  CRANIAL NERVE:   2nd - no papilledema on fundoscopic exam  2nd, 3rd, 4th, 6th - pupils equal and reactive to light, visual fields full to confrontation, extraocular muscles intact, no nystagmus  5th - facial sensation symmetric  7th - facial strength symmetric  8th - hearing intact  9th - palate elevates symmetrically, uvula midline  11th - shoulder shrug symmetric  12th - tongue protrusion midline  MOTOR:   normal bulk and tone, full strength in the BUE, BLE  SENSORY:   normal and symmetric to light touch, temperature, vibration  COORDINATION:   finger-nose-finger, fine finger movements normal  REFLEXES:   deep tendon reflexes present and symmetric  GAIT/STATION:   narrow based gait; able to walk tandem; romberg is negative    DIAGNOSTIC DATA (LABS, IMAGING, TESTING) - I reviewed patient records, labs, notes, testing and imaging myself where available.  Lab Results  Component Value Date   WBC 5.8 03/09/2016   HGB 12.4 03/09/2016   HCT 38.0 03/09/2016   MCV 88.4 03/09/2016   PLT 338 03/09/2016      Component Value Date/Time   NA 139 05/07/2016 1154   K 4.1 05/07/2016 1154   CL 105 05/07/2016 1154   CO2 24 05/07/2016 1154   GLUCOSE 87 05/07/2016 1154   BUN 7 05/07/2016 1154   CREATININE 0.61 05/07/2016 1154   CALCIUM 9.3 05/07/2016 1154   PROT 6.9 05/07/2016 1154   ALBUMIN 3.9 05/07/2016 1154   AST 13 05/07/2016 1154   ALT 11 05/07/2016 1154   ALKPHOS 82 05/07/2016 1154   BILITOT 0.2 05/07/2016 1154   Lab Results  Component Value Date    CHOL 180 (H) 03/09/2016   HDL 75 03/09/2016   LDLCALC 90 03/09/2016   TRIG 74 03/09/2016   CHOLHDL 2.4 03/09/2016   Lab Results  Component Value Date   HGBA1C 5.2 08/10/2016   No results found for: [id number] Lab Results  Component Value Date   TSH 0.77 05/07/2016       ASSESSMENT AND PLAN  21 y.o. year old female here with Migraine without aura, since second grade. Headaches are increasing in severity and frequency, therefore we'll check MRI of the brain to rule out other secondary causes. Will start migraine treatment as well with topiramate and rizatriptan. Headache education reviewed.  Dx:  1. Migraine without aura and without status migrainosus, not intractable   2. Other headache syndrome      PLAN:  - check  MRI brain (increasing severity, frequency; rule out secondary causes)  - start topiramate 50mg  at bedtime; after 1 week increase to twice a day; drink plenty of water  - rizatriptan 10mg  as needed for breakthrough headache; may repeat x 1 after 2 hours; max 2 tabs per day or 8 per month  - To prevent or relieve headaches, try the following:   Cool Compress. Lie down and place a cool compress on your head.   Avoid headache triggers. If certain foods or odors seem to have triggered your migraines in the past, avoid them. A headache diary might help you identify triggers.   Include physical activity in your daily routine.   Manage stress. Find healthy ways to cope with the stressors, such as delegating tasks on your to-do list.   Practice relaxation techniques. Try deep breathing, yoga, massage and visualization.   Eat regularly. Eating regularly scheduled meals and maintaining a healthy diet might help prevent headaches. Also, drink plenty of fluids.   Follow a regular sleep schedule. Sleep deprivation might contribute to headaches  Consider biofeedback. With this mind-body technique, you learn to control certain bodily functions - such as muscle  tension, heart rate and blood pressure - to prevent headaches or reduce headache pain.   Orders Placed This Encounter  Procedures  . MR BRAIN W WO CONTRAST   Meds ordered this encounter  Medications  . topiramate (TOPAMAX) 50 MG tablet    Sig: Take 1 tablet (50 mg total) by mouth 2 (two) times daily.    Dispense:  60 tablet    Refill:  12  . rizatriptan (MAXALT-MLT) 10 MG disintegrating tablet    Sig: Take 1 tablet (10 mg total) by mouth as needed for migraine. May repeat in 2 hours if needed    Dispense:  9 tablet    Refill:  11   Return in about 3 months (around 06/22/2017).    Suanne Marker, MD 03/22/2017, 8:56 AM Certified in Neurology, Neurophysiology and Neuroimaging  [facility] [address], [address] [place], [place] [address] [phone]

## 2017-04-03 ENCOUNTER — Ambulatory Visit: Payer: Self-pay | Admitting: Family

## 2017-04-05 ENCOUNTER — Ambulatory Visit (INDEPENDENT_AMBULATORY_CARE_PROVIDER_SITE_OTHER): Payer: BLUE CROSS/BLUE SHIELD | Admitting: Family

## 2017-04-05 ENCOUNTER — Encounter: Payer: Self-pay | Admitting: Family

## 2017-04-05 VITALS — BP 115/74 | HR 67 | Ht 60.0 in | Wt 207.4 lb

## 2017-04-05 DIAGNOSIS — F4323 Adjustment disorder with mixed anxiety and depressed mood: Secondary | ICD-10-CM | POA: Diagnosis not present

## 2017-04-05 DIAGNOSIS — Z9109 Other allergy status, other than to drugs and biological substances: Secondary | ICD-10-CM

## 2017-04-05 MED ORDER — FLUOXETINE HCL 20 MG PO CAPS: 20.0000 mg | ORAL_CAPSULE | Freq: Every day | ORAL | 0 refills | Status: DC

## 2017-04-05 MED ORDER — MONTELUKAST SODIUM 10 MG PO TABS: 10.0000 mg | ORAL_TABLET | Freq: Every day | ORAL | 0 refills | Status: DC

## 2017-04-05 NOTE — Patient Instructions (Signed)
Start taking Singulair nightly for allergies.  Continue with Prozac 20 mg.  Please find out resources at school for you to get a counselor or therapist.  Send me a my chart message when you find out information about that!

## 2017-04-05 NOTE — Progress Notes (Signed)
THIS RECORD MAY CONTAIN CONFIDENTIAL INFORMATION THAT SHOULD NOT BE RELEASED WITHOUT REVIEW OF THE SERVICE PROVIDER.  Adolescent Medicine Consultation Follow-Up Visit Kristin Blanchard  is a 21 y.o. female referred by Tilman Neat, MD here today for follow-up regarding adjustment disorder with mixed anxiety and depressed mood.    Last seen in Adolescent Medicine Clinic on 03/07/2017 for same.  Plan at last visit included: .-start prozac 20 mg  -start hydroxyzine 25 TID as needed for breakthrough anxiety or sleep  -return in 3 weeks for med check   Pertinent Labs? No Growth Chart Viewed? no   History was provided by the patient.  Interpreter? no  PCP Confirmed?  yes  My Chart Activated?   yes  Patient's personal or confidential phone number:   Chief Complaint  Patient presents with  . Follow-up  . PCOS    HPI:   -having seasonal allergies c/o of nasal stuffiness, congestion. No fever, chills, no purulent drainage -took Singulair for years when younger, now only zyrtec.  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  -interested in therapist - possibly at school [facility])  Review of Systems  Constitutional: Negative for malaise/fatigue.  Eyes: Negative for double vision.  Respiratory: Negative for shortness of breath.   Cardiovascular: Negative for chest pain and palpitations.  Gastrointestinal: Negative for abdominal pain, constipation, diarrhea, nausea and vomiting.  Genitourinary: Negative for dysuria.  Musculoskeletal: Negative for joint pain and myalgias.  Skin: Negative for rash.  Neurological: Negative for dizziness.  Endo/Heme/Allergies: Does not bruise/bleed easily.    No LMP recorded. Patient is not currently having periods (Reason: Irregular Periods). No Known Allergies Outpatient Medications Prior to Visit  Medication Sig Dispense Refill  . albuterol  (PROVENTIL HFA;VENTOLIN HFA) 108 (90 Base) MCG/ACT inhaler Inhale 2 puffs into the lungs every 4 hours as needed to treat wheezes, cough, shortness of breath 1 Inhaler 1  . cetirizine (ZYRTEC) 10 MG tablet Take 1 tablet (10 mg total) by mouth daily. 30 tablet 6  . FLUoxetine (PROZAC) 20 MG capsule Take 1 capsule (20 mg total) by mouth daily. 30 capsule 0  . hydrOXYzine (VISTARIL) 25 MG capsule Take 1 capsule (25 mg total) by mouth 3 (three) times daily as needed. 30 capsule 0  . ondansetron (ZOFRAN ODT) 4 MG disintegrating tablet Take 1 tab, dissolve on the tongue in the morning for nausea. 30 tablet 1  . rizatriptan (MAXALT-MLT) 10 MG disintegrating tablet Take 1 tablet (10 mg total) by mouth as needed for migraine. May repeat in 2 hours if needed 9 tablet 11  . topiramate (TOPAMAX) 50 MG tablet Take 1 tablet (50 mg total) by mouth 2 (two) times daily. 60 tablet 12   No facility-administered medications prior to visit.      Patient Active Problem List   Diagnosis Date Noted  . Patellar pain, right 12/10/2016  . Irregular menses 05/07/2016  . Nonintractable headache 05/07/2016  . Vitamin D deficiency 08/24/2015  . Obesity 10/22/2014  . Asthma, chronic 02/09/2014  . Environmental allergies 02/09/2014    Confidentiality was discussed with the patient and if applicable, with caregiver as well.  The following portions of the patient's history were reviewed and updated as appropriate: allergies, current medications, past family history, past medical history, past social history, past surgical history and problem list.  Physical Exam:  Vitals:   04/05/17 1143  BP: 115/74  Pulse: 67  Weight: 207 lb 6.4 oz (94.1 kg)  Height: 5' (1.524 m)   BP 115/74 (BP Location: Right Arm, Patient Position: Sitting, Cuff Size: Large)   Pulse 67   Ht 5' (1.524 m)   Wt 207 lb 6.4 oz (94.1 kg)   BMI 40.51 kg/m  Body mass index: body mass index is 40.51 kg/m. Blood pressure percentiles are 74 %  systolic and 85 % diastolic based on the August 2017 AAP Clinical Practice Guideline. Blood pressure percentile targets: 90: 123/76, 95: 127/79, 95 + 12 mmHg: 139/91.  Wt Readings from Last 3 Encounters:  04/05/17 207 lb 6.4 oz (94.1 kg) (98 %, Z= 2.05)*  03/22/17 208 lb (94.3 kg) (98 %, Z= 2.06)*  03/07/17 209 lb 3.2 oz (94.9 kg) (98 %, Z= 2.08)*   * Growth percentiles are based on CDC (Girls, 2-20 Years) data.    Physical Exam  Constitutional: She appears well-developed. No distress.  HENT:  Head: Normocephalic and atraumatic.  Nose: Mucosal edema present.  Mouth/Throat: Posterior oropharyngeal erythema present. No oropharyngeal exudate.  Eyes: EOM are normal. Pupils are equal, round, and reactive to light. No scleral icterus.  Neck: Normal range of motion. Neck supple.  Cardiovascular: Normal rate and regular rhythm.  No murmur heard. Pulmonary/Chest: Effort normal and breath sounds normal.  Abdominal: Soft.  Musculoskeletal: Normal range of motion. She exhibits no edema.  Lymphadenopathy:    She has no cervical adenopathy.  Neurological: She is alert.  Skin: Skin is warm and dry. No rash noted.  Psychiatric: Her mood appears anxious.   Assessment/Plan: 1. Adjustment disorder with mixed anxiety and depressed mood -continue prozac 20 mg.  -reviewed that she will likely need higher dose -she is agreeable to initiate therapy through school and reevaluate med dose after trying therapy  -BBW and return precautions given   2. Environmental allergies -start singulair; return to PCP for follow-up   Follow-up:  [facility], medication follow-up.   Medical decision-making:  >25 minutes spent face to face with patient with more than 50% of appointment spent discussing diagnosis, management, follow-up, and reviewing medication compliance, return precautions, importance of therapist for anxiety/depression management. Needs labs at next OV.

## 2017-04-12 ENCOUNTER — Encounter: Payer: Self-pay | Admitting: Family

## 2017-05-08 ENCOUNTER — Ambulatory Visit: Payer: Self-pay | Admitting: Family

## 2017-05-10 ENCOUNTER — Ambulatory Visit: Payer: Self-pay | Admitting: Family

## 2017-05-17 ENCOUNTER — Encounter: Payer: Self-pay | Admitting: Family

## 2017-05-17 ENCOUNTER — Ambulatory Visit: Payer: BLUE CROSS/BLUE SHIELD | Admitting: Family

## 2017-05-17 ENCOUNTER — Ambulatory Visit (INDEPENDENT_AMBULATORY_CARE_PROVIDER_SITE_OTHER): Payer: Self-pay | Admitting: Licensed Clinical Social Worker

## 2017-05-17 VITALS — BP 133/85 | HR 80 | Ht 60.24 in | Wt 211.4 lb

## 2017-05-17 DIAGNOSIS — F4323 Adjustment disorder with mixed anxiety and depressed mood: Secondary | ICD-10-CM

## 2017-05-17 MED ORDER — ESCITALOPRAM OXALATE 10 MG PO TABS: 10.0000 mg | ORAL_TABLET | Freq: Every day | ORAL | 0 refills | Status: DC

## 2017-05-17 NOTE — Progress Notes (Signed)
THIS RECORD MAY CONTAIN CONFIDENTIAL INFORMATION THAT SHOULD NOT BE RELEASED WITHOUT REVIEW OF THE SERVICE PROVIDER.  Adolescent Medicine Consultation Follow-Up Visit Kristin Blanchard  is a 21 y.o. female referred by [name], [name], MD here today for follow-up regarding adjustment disorder with mixed anxiety and depressed mood.    Last seen in Adolescent Medicine Clinic on 04/05/17 for same  Plan at last visit included continued Prozac 20 mg, therapy at school, may need higher dose.  Started Singulair for environmental allergies.   Pertinent Labs? No Growth Chart Viewed? no   History was provided by the patient.  Interpreter? no  PCP Confirmed?  yes  My Chart Activated?   yes   No chief complaint on file.   HPI:    -see BH note from today.  -concerned she may be bipolar, moods up and down.  -denies SI/HI -sleeping more, eating normally; not having spending issues, increased sexual activities; no drug use or ETOH abuse.  -open to medication management for anxiety/stressors. More irritable.   Review of Systems  Constitutional: Negative for malaise/fatigue.  Eyes: Negative for double vision.  Respiratory: Negative for shortness of breath.   Cardiovascular: Negative for chest pain and palpitations.  Gastrointestinal: Negative for abdominal pain, constipation, diarrhea, nausea and vomiting.  Genitourinary: Negative for dysuria.  Musculoskeletal: Negative for joint pain and myalgias.  Skin: Negative for rash.  Neurological: Positive for headaches. Negative for dizziness.  Endo/Heme/Allergies: Does not bruise/bleed easily.  Psychiatric/Behavioral: Negative for depression, substance abuse and suicidal ideas. The patient is nervous/anxious. The patient does not have insomnia.    No LMP recorded. Patient is not currently having periods (Reason: Irregular Periods). No Known Allergies Outpatient Medications Prior to Visit  Medication Sig Dispense Refill  . albuterol (PROVENTIL  HFA;VENTOLIN HFA) 108 (90 Base) MCG/ACT inhaler Inhale 2 puffs into the lungs every 4 hours as needed to treat wheezes, cough, shortness of breath 1 Inhaler 1  . cetirizine (ZYRTEC) 10 MG tablet Take 1 tablet (10 mg total) by mouth daily. 30 tablet 6  . FLUoxetine (PROZAC) 20 MG capsule Take 1 capsule (20 mg total) by mouth daily. 30 capsule 0  . hydrOXYzine (VISTARIL) 25 MG capsule Take 1 capsule (25 mg total) by mouth 3 (three) times daily as needed. 30 capsule 0  . montelukast (SINGULAIR) 10 MG tablet Take 1 tablet (10 mg total) by mouth at bedtime. 30 tablet 0  . ondansetron (ZOFRAN ODT) 4 MG disintegrating tablet Take 1 tab, dissolve on the tongue in the morning for nausea. 30 tablet 1  . rizatriptan (MAXALT-MLT) 10 MG disintegrating tablet Take 1 tablet (10 mg total) by mouth as needed for migraine. May repeat in 2 hours if needed 9 tablet 11  . topiramate (TOPAMAX) 50 MG tablet Take 1 tablet (50 mg total) by mouth 2 (two) times daily. 60 tablet 12   No facility-administered medications prior to visit.      Patient Active Problem List   Diagnosis Date Noted  . Patellar pain, right 12/10/2016  . Irregular menses 05/07/2016  . Nonintractable headache 05/07/2016  . Vitamin D deficiency 08/24/2015  . Obesity 10/22/2014  . Asthma, chronic 02/09/2014  . Environmental allergies 02/09/2014    Physical Exam:  Vitals:   05/17/17 1111  Weight: 211 lb 6.4 oz (95.9 kg)  Height: 5' 0.24" (1.53 m)   Ht 5' 0.24" (1.53 m)   Wt 211 lb 6.4 oz (95.9 kg)   BMI 40.96 kg/m  Body mass index: body mass index  is 40.96 kg/m. No blood pressure reading on file for this encounter.   Physical Exam  Constitutional: She appears well-developed. No distress.  HENT:  Head: Normocephalic and atraumatic.  Eyes: EOM are normal. Pupils are equal, round, and reactive to light. No scleral icterus.  Neck: Normal range of motion. Neck supple. No thyromegaly present.  Cardiovascular: Normal rate and regular  rhythm.  No murmur heard. Pulmonary/Chest: Effort normal and breath sounds normal.  Abdominal: Soft.  Musculoskeletal: Normal range of motion. She exhibits no edema.  Lymphadenopathy:    She has no cervical adenopathy.  Neurological: She is alert.  Skin: Skin is warm and dry. No rash noted.    Assessment/Plan:  1. Adjustment disorder with mixed anxiety and depressed mood -start lexapro 10 mg daily in the AM  -Reviewed BBW and return precautions.  -reviewed typical AEs for SSRIs including nausea, stomachache, headaches, tremor, night sweats, 4-6 weeks before efficacious.   Follow-up:  return for med follow up in 2-3 weeks.   Medical decision-making:  >15 minutes spent face to face with patient with more than 50% of appointment spent discussing diagnosis, management, follow-up, and reviewing medication management for anxiety/depressive features.

## 2017-05-17 NOTE — BH Specialist Note (Signed)
Integrated Behavioral Health Follow Up Visit  MRN: [id number] Name: Kristin Blanchard  Number of Integrated Behavioral Health Clinician visits: 8th Session Start time: 11:27 AM  Session End time: 11:53 AM  Total time: 36 minutes  Type of Service: Integrated Behavioral Health- Individual/Family Interpretor:No. Interpretor Name and Language: N/a   Warm Hand Off Completed.        SUBJECTIVE: Kristin Blanchard is a 21 y.o. female accompanied by Patient Patient was referred by C. Millican, NP for mood concerns. Patient reports the following symptoms/concerns: Patient feels like she is cycling between "high and low mood." Patient concerned that she has Bipolar disorder due to google-ing. Duration of problem: Weeks; Severity of problem: moderate  OBJECTIVE: Mood: Anxious and Euthymic and Affect: Appropriate Risk of harm to self or others: No plan to harm self or others -Denies plan, intent, desire to harm/kill self or others.  LIFE CONTEXT: Family and Social: Lives with parents School/Work: 2nd year at [employer] Self-Care: [place]jo[place] being with [place] friends in [place] Life Changes: Adjusting to college,   GOALS ADDRESSED: Patient will: 1.  Reduce symptoms of: anxiety  2.  Increase knowledge and/or ability of: coping skills, healthy habits and self-management skills  3.  Demonstrate ability to: Increase healthy adjustment to current life circumstances  INTERVENTIONS: Interventions utilized:  Mining engineerBehavioral Activation, Supportive Counseling and Psychoeducation and/or Health Education Standardized Assessments completed: MDQ and PHQ-SADS    Mood Disorder Questionnaire - Score was positive on screen, reviewed with Christy, suspect anxiety vs. Bi-polar. See NP note for additional information.  PHQ-SADS Score: 7 20 22   PHQ SADS 05/17/2017  1. Stomach pain.......... 0  2. Back Pain.......... 0  3. Pain in your arms, legs, or joints (knees, hips, etc.).......... 0  4. Feeling tired or having  little energy.......... 2  5. Trouble falling or staying asleep, or sleeping too much.......... 2  6. Menstrual cramps or other problems with your periods.......... 0  7. Pain or problems during sexual intercourse.......... 0  8. Headaches.......... 1  9. Chest pain.......... 0  10. Dizziness.......... 0  11. Fainting spells.......... 0  12. Feeling your heart pound or race.......... 2  13. Shortness of breath.......... 0  14. Constipation, loose bowels, or diarrhea.......... 0  15. Nausea, gas, or indigestion.......... 0  PHQ-15 Score 7  1. Feeling Nervous, Anxious, or on Edge 3  2. Not Being Able to Stop or Control Worrying 3  3. Worrying Too Much About Different Things 3  4. Trouble Relaxing 3  5. Being So Restless it's Hard To Sit Still 2  6. Becoming Easily Annoyed or Irritable 3  7. Feeling Afraid As If Something Awful Might Happen 3  Total GAD-7 Score 20  a. In the last 4 weeks, have you had an anxiety attack-suddenly feeling fear or panic? Yes  b. Has this ever happened before? Yes  c. Do some of these attacks come suddenly out of the blue-that is, in situations where you don't expect to be nervous or uncomfortable? Yes  d. Do these attacks bother you a lot or are you worried about having another attack? Yes  e. During your last bad anxiety attack, did you have symptoms like shortness of breath, sweating, or your heart racing, pounding or skipping? Yes  Little interest or pleasure in doing things 3  Feeling down, depressed, or hopeless 3  Trouble falling or staying asleep, or sleeping too much 3  Feeling tired or having little energy 3  Poor appetite or overeating 3  Feeling bad about  yourself - or that you are a failure or have let yourself or your family down 2  Trouble concentrating on things, such as reading the newspaper or watching television 2  Moving or speaking so slowly that other people could have noticed. Or the opposite - being so fidgety or restless that you  have been moving around a lot more than usual 3  Thoughts that you would be better off dead, or of hurting yourself in some way 0  Score 22  If you checked off any problems on this questionnaire, how difficult have these problems made it for you to do your work, take care of things at home, or get along with other people? Very difficult   ASSESSMENT: Patient currently experiencing overwhelming anxiety, challenging peer relationships.   Patient may benefit from a medication adjustment, continued outpatient therapy, continued use of positive coping skills.  PLAN: 1. Follow up with behavioral health clinician on : 05/31/17 2. Behavioral recommendations: Patient to journal, talk to supportive family members. Patient to take medication as prescribed. 3. Referral(s[facility] 4. "From scale of 1-10, how likely are you to follow plan?": Patient agrees to plan  Gaetana MichaelisShannon W Makeisha Jentsch, LCSWA

## 2017-05-30 ENCOUNTER — Ambulatory Visit: Payer: Self-pay | Admitting: *Deleted

## 2017-05-31 ENCOUNTER — Ambulatory Visit: Payer: BLUE CROSS/BLUE SHIELD | Admitting: Family

## 2017-05-31 ENCOUNTER — Encounter: Payer: Self-pay | Admitting: Family

## 2017-05-31 ENCOUNTER — Ambulatory Visit (INDEPENDENT_AMBULATORY_CARE_PROVIDER_SITE_OTHER): Payer: BLUE CROSS/BLUE SHIELD | Admitting: Licensed Clinical Social Worker

## 2017-05-31 DIAGNOSIS — F4323 Adjustment disorder with mixed anxiety and depressed mood: Secondary | ICD-10-CM

## 2017-05-31 MED ORDER — ESCITALOPRAM OXALATE 10 MG PO TABS: 10.0000 mg | ORAL_TABLET | Freq: Every day | ORAL | 0 refills | Status: DC

## 2017-05-31 NOTE — Progress Notes (Signed)
THIS RECORD MAY CONTAIN CONFIDENTIAL INFORMATION THAT SHOULD NOT BE RELEASED WITHOUT REVIEW OF THE SERVICE PROVIDER.  Adolescent Medicine Consultation Follow-Up Visit Kristin Blanchard  is a 21 y.o. female referred by [name], [name], MD here today for follow-up regarding medication follow-up for anxiety.  Last seen in Adolescent Medicine Clinic on 05/17/17 for same.  Plan at last visit included initiation of Lexapro 10 mg.  Pertinent Labs? No Growth Chart Viewed? no   History was provided by the patient.  Interpreter? no  PCP Confirmed?  yes  My Chart Activated?  yes   Chief Complaint  Patient presents with  . Follow-up  . Medication Management    HPI:    -See BH note from today.  -Taking Lexapro daily since last OV without missed doses  -Denies SI/HI -No appreciable change, however notes more clarity/reflections -irritable in the context of family/holiday stress at home -pertinent neg: HA, nausea, tremor, upset stomach.   Review of Systems  Constitutional: Negative for malaise/fatigue.  Eyes: Negative for double vision.  Respiratory: Negative for shortness of breath.   Cardiovascular: Negative for chest pain and palpitations.  Gastrointestinal: Negative for abdominal pain, constipation, diarrhea, nausea and vomiting.  Genitourinary: Negative for dysuria.  Musculoskeletal: Negative for joint pain and myalgias.  Skin: Negative for rash.  Neurological: Negative for dizziness and headaches.  Endo/Heme/Allergies: Does not bruise/bleed easily.   No LMP recorded. Patient is not currently having periods (Reason: Irregular Periods). No Known Allergies Outpatient Medications Prior to Visit  Medication Sig Dispense Refill  . escitalopram (LEXAPRO) 10 MG tablet Take 1 tablet (10 mg total) by mouth daily. 30 tablet 0  . rizatriptan (MAXALT-MLT) 10 MG disintegrating tablet Take 1 tablet (10 mg total) by mouth as needed for migraine. May repeat in 2 hours if needed 9 tablet 11  .  albuterol (PROVENTIL HFA;VENTOLIN HFA) 108 (90 Base) MCG/ACT inhaler Inhale 2 puffs into the lungs every 4 hours as needed to treat wheezes, cough, shortness of breath (Patient not taking: Reported on 05/17/2017) 1 Inhaler 1  . cetirizine (ZYRTEC) 10 MG tablet Take 1 tablet (10 mg total) by mouth daily. (Patient not taking: Reported on 05/17/2017) 30 tablet 6  . hydrOXYzine (VISTARIL) 25 MG capsule Take 1 capsule (25 mg total) by mouth 3 (three) times daily as needed. (Patient not taking: Reported on 05/17/2017) 30 capsule 0  . montelukast (SINGULAIR) 10 MG tablet Take 1 tablet (10 mg total) by mouth at bedtime. (Patient not taking: Reported on 05/17/2017) 30 tablet 0  . ondansetron (ZOFRAN ODT) 4 MG disintegrating tablet Take 1 tab, dissolve on the tongue in the morning for nausea. (Patient not taking: Reported on 05/31/2017) 30 tablet 1  . topiramate (TOPAMAX) 50 MG tablet Take 1 tablet (50 mg total) by mouth 2 (two) times daily. (Patient not taking: Reported on 05/31/2017) 60 tablet 12   No facility-administered medications prior to visit.      Patient Active Problem List   Diagnosis Date Noted  . Patellar pain, right 12/10/2016  . Irregular menses 05/07/2016  . Nonintractable headache 05/07/2016  . Vitamin D deficiency 08/24/2015  . Obesity 10/22/2014  . Asthma, chronic 02/09/2014  . Environmental allergies 02/09/2014   The following portions of the patient's history were reviewed and updated as appropriate: allergies, current medications, past medical history and problem list.  Physical Exam:  Vitals:   05/31/17 1141  BP: 121/81  Pulse: 77  Weight: 210 lb 6.4 oz (95.4 kg)  Height: 4' 11.84" (1.52 m)  BP 121/81   Pulse 77   Ht 4' 11.84" (1.52 m)   Wt 210 lb 6.4 oz (95.4 kg)   BMI 41.31 kg/m  Body mass index: body mass index is 41.31 kg/m. Blood pressure percentiles are 86 % systolic and 97 % diastolic based on the August 2017 AAP Clinical Practice Guideline. Blood pressure  percentile targets: 90: 123/76, 95: 127/79, 95 + 12 mmHg: 139/91. This reading is in the Stage 1 hypertension range (BP >= 130/80).   Physical Exam  Constitutional: She is oriented to person, place, and time. She appears well-developed. No distress.  HENT:  Head: Normocephalic and atraumatic.  Eyes: EOM are normal. Pupils are equal, round, and reactive to light. No scleral icterus.  Neck: Normal range of motion. Neck supple.  Cardiovascular: Normal rate and regular rhythm.  No murmur heard. Pulmonary/Chest: Effort normal and breath sounds normal.  Abdominal: Soft.  Musculoskeletal: Normal range of motion. She exhibits no edema.  Lymphadenopathy:    She has no cervical adenopathy.  Neurological: She is alert and oriented to person, place, and time. No cranial nerve deficit.  Skin: Skin is warm and dry. No rash noted.  Psychiatric: She has a normal mood and affect.  Visibly less anxious today     Assessment/Plan: 1. Adjustment disorder with mixed anxiety and depressed mood -continue with lexapro 10 mg. I believe she is experiencing normal  -therapy at school starts first day back  -return in 4 weeks, which will be 6 weeks after initiation of medication, need PHQSADS at that time.    Follow-up: 4 weeks with Christianne Dolinhristy Millican, FNP-C for medication management.   Medical decision-making:  >15 minutes spent face to face with patient with more than 50% of appointment spent discussing diagnosis, management, follow-up, and reviewing medication compliance, expected and adverse effects of medications, as well as CBT through school.

## 2017-05-31 NOTE — BH Specialist Note (Signed)
Integrated Behavioral Health Follow Up Visit  MRN: [id number] Name: Kristin Blanchard  Number of Integrated Behavioral Health Clinician visits: 9th Session Start time: 11:50A  Session End time: 12:02 PM  Total time: 12 minutes  Type of Service: Integrated Behavioral Health- Individual/Family Interpretor:No. Interpretor Name and Language: N/A.   Warm Hand Off Completed.       SUBJECTIVE: Kristin Blanchard is a 21 y.o. female accompanied by Patient Patient was referred by Kristin Dolinhristy Millican, NP for Medication Monitoring. Patient reports the following symptoms/concerns: less anxious today, voices that she is very irritable Duration of problem: Ongoing; Severity of problem: moderate  OBJECTIVE: Mood: Euthymic and Affect: Appropriate Risk of harm to self or others: No plan to harm self or others  LIFE CONTEXT: Family and Social: Lives with parents School/Work:2nd year at [employer] State Self-Care: Enjoys being with her friends in [place] Life Changes: Adjusting to college,   GOALS ADDRESSED: Patient will: 1.  Reduce symptoms of: anxiety  2.  Increase knowledge and/or ability of: coping skills, healthy habits and self-management skills  3.  Demonstrate ability to: Increase healthy adjustment to current life circumstances  INTERVENTIONS: Interventions utilized:  Solution-Focused Strategies, Brief CBT and Psychoeducation and/or Health Education Standardized Assessments completed: Not Needed   Started medication on the next day after appointment Yes to everyday No SI  The Antidepressant Side Effect Checklist (ASEC)  Symptom Score (0-3) Linked to Medication? Comments  Dry Mouth 1    Drowsiness 0    Insomnia 3 No   Blurred Vision 1    Headache 0    Constipation 0    Diarrhea  0    Increased Appetite 0    Decreased Appetite 0    Nausea/Vomiting 0    Problems Urinating 0    Problems with Sex 0    Palpitations 0    Lightheaded on Standing 0    Room Spinning 0    Sweating 0     Feeling Hot 0    Tremor 0    Disoriented 0    Yawning 0    Weight Gain 0    Other Symptoms? No   Treatment for Side Effects? No  Side Effects make you want to stop taking?? No   Any positives - No. More irritable, unclear at what's going on?  Therapy is already set up, first day of classes - ready for semester!   ASSESSMENT: Patient currently experiencing compliance with medication. Patient is irritable, getting little sleep due to guest at her home and holidays. Patient states she is ready to return to school.   Patient may benefit from continued medication compliance, connection to therapy.  PLAN: 1. Follow up with behavioral health clinician on : As needed 2. Behavioral recommendations: Patient to continue to be compliant with medication and to attend therapy. Patient to work on Research scientist (life sciences)healthy communication skills with family. 3. Referral(s): None at this time, has therapy arranged for self at school 4. "From scale of 1-10, how likely are you to follow plan?": Not assessed  No charge for this visit due to brief length of time.  Gaetana MichaelisShannon W Uzair Godley, LCSWA

## 2017-05-31 NOTE — Patient Instructions (Signed)
Continue Lexapro daily.  Keep plan to see therapist at school on first day.  Return in one month or sooner as needed.

## 2017-06-03 ENCOUNTER — Encounter: Payer: Self-pay | Admitting: Family

## 2017-06-06 ENCOUNTER — Encounter: Payer: BLUE CROSS/BLUE SHIELD | Attending: Pediatrics | Admitting: *Deleted

## 2017-06-06 DIAGNOSIS — E282 Polycystic ovarian syndrome: Secondary | ICD-10-CM | POA: Diagnosis not present

## 2017-06-06 DIAGNOSIS — Z713 Dietary counseling and surveillance: Secondary | ICD-10-CM | POA: Diagnosis not present

## 2017-06-06 NOTE — Progress Notes (Signed)
Medical Nutrition Therapy:  Appt start time: 1100 end time:  1130   Assessment:  Primary concerns today: Kristin Blanchard is here for nutrition counseling pertaining to PCOS.   Changed major to poli sci.  Wants to move to [place]  States this past semester was difficult.  Mood fluctuated. Went to therapist at school.  Family/culture doesn't understand mental health. Thinks medication is better.  Mood is poor Not sleeping well..  Energy ok.  No longer needs naps Goes to bed late over break Headaches often.  Saw neurology who prescribed topomax.  Not taking it.  Thinks she is in caffeine withdrawal. No GI distress  Trying to eat when she is hungry.  Typically eats twice a day or three times on campus Body image worse around family  Preferred Learning Style:  No preference indicated   Learning Readiness:  Change in progress  DIETARY INTAKE:  24-hr recall:  [employer] 4 for $4 Frozen yogurt  Normally Breakfast: toast with chicken sausage and OJ 1-2 pm : Chicken, rice, vegetables Not dinner at home  At school Coffee with bagel Eats dining hall has pizza and salad Gets food elsewhere (CFA, etc)   Usual physical activity:  None     Nutritional Diagnosis:  [place]-2.1 Inpaired nutrition utilization As related to carbohyrate.  As evidenced by PCOS.    Intervention:  Nutrition counseling provided.  Discussed 3 meals/day with adequate protein, wear contacts, take ovasitol, recommended omega-3.  Exercise, if able, fruits and veggies.  Discouraged dieting and pursuit of weight loss  Teaching Method Utilized:  Auditory     Barriers to learning/adherence to lifestyle change: family  Demonstrated degree of understanding via:  Teach Back   Monitoring/Evaluation:  Dietary intake, exercise, labs, and body weight prn.

## 2017-07-05 ENCOUNTER — Encounter: Payer: Self-pay | Admitting: Family

## 2017-07-05 ENCOUNTER — Ambulatory Visit: Payer: BLUE CROSS/BLUE SHIELD | Admitting: Family

## 2017-07-05 VITALS — BP 111/71 | HR 71 | Ht 59.55 in | Wt 212.0 lb

## 2017-07-05 DIAGNOSIS — F4323 Adjustment disorder with mixed anxiety and depressed mood: Secondary | ICD-10-CM | POA: Diagnosis not present

## 2017-07-05 MED ORDER — ESCITALOPRAM OXALATE 10 MG PO TABS: 10.0000 mg | ORAL_TABLET | Freq: Every day | ORAL | 1 refills | Status: DC

## 2017-07-05 NOTE — Progress Notes (Signed)
THIS RECORD MAY CONTAIN CONFIDENTIAL INFORMATION THAT SHOULD NOT BE RELEASED WITHOUT REVIEW OF THE SERVICE PROVIDER.  Adolescent Medicine Consultation Follow-Up Visit Kristin Blanchard  is a 22 y.o. female referred by [name], [name], MD here today for follow-up regarding adjustment disorder with mixed anxiety and depressed mood.   Last seen in Adolescent Medicine Clinic on 05/31/17 for same.  Plan at last visit included lexapro 10 mg. Therapy at school.   Pertinent Labs? No Growth Chart Viewed? no   History was provided by the patient.  Interpreter? no  PCP Confirmed?  yes  My Chart Activated?   yes    Chief Complaint  Patient presents with  . Follow-up  . Medication Management    HPI:   -taking Lexapro 10 mg with no missed doses -feels really good. Happy and confident -likes therapist at school.  -no SI/HI -was sick with cold symptoms/cough at school and was treated with abx and tessalon perles. Cannot recall abx.  -cough still persists. White/clear sputum. No travel  -increased caffeine intake with school -headaches have decreased with lexapro use.    Review of Systems  Constitutional: Negative for malaise/fatigue.  Eyes: Negative for double vision.  Respiratory: Negative for shortness of breath.   Cardiovascular: Negative for chest pain and palpitations.  Gastrointestinal: Negative for abdominal pain, constipation, diarrhea, nausea and vomiting.  Genitourinary: Negative for dysuria.  Musculoskeletal: Negative for joint pain and myalgias.  Skin: Negative for rash.  Neurological: Negative for dizziness and headaches.  Endo/Heme/Allergies: Does not bruise/bleed easily.     No LMP recorded. Patient is not currently having periods (Reason: Irregular Periods). No Known Allergies Outpatient Medications Prior to Visit  Medication Sig Dispense Refill  . escitalopram (LEXAPRO) 10 MG tablet Take 1 tablet (10 mg total) by mouth daily. 30 tablet 0  . albuterol (PROVENTIL  HFA;VENTOLIN HFA) 108 (90 Base) MCG/ACT inhaler Inhale 2 puffs into the lungs every 4 hours as needed to treat wheezes, cough, shortness of breath (Patient not taking: Reported on 05/17/2017) 1 Inhaler 1  . cetirizine (ZYRTEC) 10 MG tablet Take 1 tablet (10 mg total) by mouth daily. (Patient not taking: Reported on 05/17/2017) 30 tablet 6  . hydrOXYzine (VISTARIL) 25 MG capsule Take 1 capsule (25 mg total) by mouth 3 (three) times daily as needed. (Patient not taking: Reported on 05/17/2017) 30 capsule 0  . montelukast (SINGULAIR) 10 MG tablet Take 1 tablet (10 mg total) by mouth at bedtime. (Patient not taking: Reported on 05/17/2017) 30 tablet 0  . ondansetron (ZOFRAN ODT) 4 MG disintegrating tablet Take 1 tab, dissolve on the tongue in the morning for nausea. (Patient not taking: Reported on 05/31/2017) 30 tablet 1  . rizatriptan (MAXALT-MLT) 10 MG disintegrating tablet Take 1 tablet (10 mg total) by mouth as needed for migraine. May repeat in 2 hours if needed (Patient not taking: Reported on 07/05/2017) 9 tablet 11  . topiramate (TOPAMAX) 50 MG tablet Take 1 tablet (50 mg total) by mouth 2 (two) times daily. (Patient not taking: Reported on 05/31/2017) 60 tablet 12   No facility-administered medications prior to visit.      Patient Active Problem List   Diagnosis Date Noted  . Adjustment disorder with mixed anxiety and depressed mood 05/31/2017  . Patellar pain, right 12/10/2016  . Irregular menses 05/07/2016  . Nonintractable headache 05/07/2016  . Vitamin D deficiency 08/24/2015  . Obesity 10/22/2014  . Asthma, chronic 02/09/2014  . Environmental allergies 02/09/2014   Physical Exam:  Vitals:  07/05/17 1414  BP: 111/71  Pulse: 71  Weight: 212 lb (96.2 kg)  Height: 4' 11.55" (1.513 m)   BP 111/71   Pulse 71   Ht 4' 11.55" (1.513 m)   Wt 212 lb (96.2 kg)   BMI 42.04 kg/m  Body mass index: body mass index is 42.04 kg/m. Blood pressure percentiles are 56 % systolic and 76 %  diastolic based on the August 2017 AAP Clinical Practice Guideline. Blood pressure percentile targets: 90: 123/76, 95: 128/79, 95 + 12 mmHg: 140/91.   Wt Readings from Last 3 Encounters:  07/05/17 212 lb (96.2 kg) (98 %, Z= 2.11)*  05/31/17 210 lb 6.4 oz (95.4 kg) (98 %, Z= 2.09)*  05/17/17 211 lb 6.4 oz (95.9 kg) (98 %, Z= 2.10)*   * Growth percentiles are based on CDC (Girls, 2-20 Years) data.    BP Readings from Last 3 Encounters:  07/05/17 111/71 (56 %, Z = 0.15 /  76 %, Z = 0.71)*  05/31/17 121/81 (86 %, Z = 1.08 /  97 %, Z = 1.86)*  05/17/17 133/85 (98 %, Z = 2.07 /  98 %, Z = 2.14)*   *BP percentiles are based on the August 2017 AAP Clinical Practice Guideline for girls    Physical Exam  Constitutional: She appears well-developed. No distress.  HENT:  Nose: Nose normal.  Mouth/Throat: Oropharynx is clear and moist. No oropharyngeal exudate.  Neck: No thyromegaly present.  Cardiovascular: Normal rate and regular rhythm.  No murmur heard. Pulmonary/Chest: Breath sounds normal.  Abdominal: Soft. She exhibits no mass. There is no tenderness. There is no guarding.  Musculoskeletal: She exhibits no edema.  Lymphadenopathy:    She has no cervical adenopathy.  Neurological: She is alert.  Skin: Skin is warm. No rash noted.  Psychiatric: She has a normal mood and affect.  Vitals reviewed.   Assessment/Plan: 1. Adjustment disorder with mixed anxiety and depressed mood -doing really well on current medication and dosage -return precautions reviewed -keep therapy at school    [facility] screenings: PHQSADS 2/3/2 reviewed and indicated well-controlled anxiety/depressive symptoms with current regimen. Continue with current dose. Screens discussed with patient and parent and adjustments to plan made accordingly.   Follow-up:  3 months or sooner as needed. Return to student health for persistent cough. Symptom management reviewed.    Medical decision-making:  >15 minutes spent face  to face with patient with more than 50% of appointment spent reviewing Lexapro usage, expected and adverse effects. Some time was spent reviewing return precautions in the context of persistent cough after being treated at student health with abx as noted in HPI.

## 2017-07-05 NOTE — Patient Instructions (Signed)
Keep taking Lexapro 10 mg daily.  If cough persists next week, [facility] [employer] [facility].  Try spoonful of honey or honey in hot lemon tea for relief.  Also you can use Mucinex DM for congestion.

## 2017-08-12 ENCOUNTER — Encounter: Payer: Self-pay | Admitting: Diagnostic Neuroimaging

## 2017-08-12 ENCOUNTER — Ambulatory Visit: Payer: Self-pay | Admitting: Diagnostic Neuroimaging

## 2017-09-25 ENCOUNTER — Telehealth: Payer: Self-pay | Admitting: Pediatrics

## 2017-09-25 NOTE — Telephone Encounter (Signed)
Called and left VM asking pt to call office back to reschedule appointment.

## 2017-09-25 NOTE — Telephone Encounter (Signed)
Called and left voicemail for patient to call back and reschedule appointment due to provider being off in the afternoon.

## 2017-10-04 ENCOUNTER — Ambulatory Visit: Payer: BLUE CROSS/BLUE SHIELD | Admitting: Family

## 2017-10-18 ENCOUNTER — Encounter: Payer: Self-pay | Admitting: Pediatrics

## 2017-10-18 ENCOUNTER — Ambulatory Visit (INDEPENDENT_AMBULATORY_CARE_PROVIDER_SITE_OTHER): Payer: BLUE CROSS/BLUE SHIELD | Admitting: Pediatrics

## 2017-10-18 VITALS — Temp 99.0°F | Wt 219.2 lb

## 2017-10-18 DIAGNOSIS — J069 Acute upper respiratory infection, unspecified: Secondary | ICD-10-CM | POA: Diagnosis not present

## 2017-10-18 DIAGNOSIS — R0981 Nasal congestion: Secondary | ICD-10-CM | POA: Diagnosis not present

## 2017-10-18 NOTE — Progress Notes (Signed)
   Subjective:     Kristin Blanchard y.o. female who presents with sore throat and "sinus pressure."   History provider by patient No interpreter necessary.  Chief Complaint  Patient presents with  . Sore Throat    started yesterday   . nasal pressure    started last night, also throbbing like    HPI:   Kristin Blanchard, is a 22 y.o. female who presents with sore throat and "sinus pressure."  Was in usual state of health until sore throat started yesterday during the day.  Sinus pressure and fatigue started last night. No known sick contacts. No cough. No fever but has "felt warm" but not taken temperature. No tylenol or ibuprofen.   Has nasal congestion that started yesterday as well.  Hurts "behind eyes" with throbbing and pressure. Took cetirizine last night because thought it was allergies but didn't help.   Drinking ok. Was fasting yesterday for Ramadan but stopped today because she is sick. Good urine output.   Nose pierced a month and a half ago.  Soaking in saline solution. Has "bump."  Pus comes out sometimes. Happening for 1-2 weeks. No redness or pain.   Review of Systems   As given in HPI  Patient's history was reviewed and updated as appropriate: allergies, current medications, past medical history and problem list.     Objective:     Temp 99 F (37.2 C) (Temporal)   Wt 219 lb 3.2 oz (99.4 kg)   BMI 43.46 kg/m   Physical Exam   General: alert, tired-appearing teenage female. No acute distress HEENT: normocephalic, atraumatic. PERRL. Sclera white. Nares with mucous. TMs grey bilaterally. Moist mucus membranes. Oropharynx benign without lesions or exudates. Tender to palpation over frontal and maxillary sinuses Neck: supple, no cervical LAD Cardiac: normal S1 and S2. Regular rate and rhythm. No murmurs Pulmonary: normal work of breathing. No retractions. No tachypnea. Clear bilaterally without wheezes, crackles or rhonchi.  Abdomen: soft, nontender,  nondistended.  Extremities: no cyanosis. No edema. Brisk capillary refill Skin: no rashes, lesions,     Assessment & Plan:   1. Viral URI No signs of bacterial infection (lungs clear, TMs and oropharynx benign). No fevers and only 1 day of symptoms so not likely sinusitis.  Counseled that if starts to run fevers or has significantly worsening sinus pain to return to clinic.  Recommended sinus rinse (Make sure to use distilled or boiled water before mixing in the saline packet for the rinse).   2. Sinus congestion See above.   Supportive care and return precautions reviewed.  Return if symptoms worsen or fail to improve.  Glennon Hamilton, MD

## 2017-10-18 NOTE — Patient Instructions (Addendum)
It was a pleasure seeing you today, Kristin Blanchard!  We hope you feel better soon.  You likely have a virus causing your symptoms (congestion, sore throat).  For your congestion, I recommend a sinus rinse.  You can get it over the counter at any pharmacy.  Make sure to use distilled or boiled water before mixing in the saline packet for the rinse. An example is given below (doesn't need to be this version or this brand).      If you do take Nyquil, please do not take tylenol with it since it has tylenol in it.  Please return to clinic if sinus pressure is significantly worse by Monday, if you develop a fever, or any other concerns.

## 2017-12-17 ENCOUNTER — Encounter: Payer: Self-pay | Admitting: Diagnostic Neuroimaging

## 2017-12-17 ENCOUNTER — Ambulatory Visit: Payer: BLUE CROSS/BLUE SHIELD | Admitting: Diagnostic Neuroimaging

## 2017-12-17 VITALS — BP 105/66 | HR 68 | Ht 59.5 in | Wt 228.2 lb

## 2017-12-17 DIAGNOSIS — G43009 Migraine without aura, not intractable, without status migrainosus: Secondary | ICD-10-CM | POA: Diagnosis not present

## 2017-12-17 MED ORDER — RIZATRIPTAN BENZOATE 10 MG PO TBDP: 10.0000 mg | ORAL_TABLET | ORAL | 11 refills | Status: DC | PRN

## 2017-12-17 NOTE — Progress Notes (Signed)
[facility]  PATIENT: Kristin Blanchard DOB: [birth year]  REFERRING CLINICIAN: Candida Peeling, NP HISTORY FROM: patient and chart  REASON FOR VISIT: follow up   HISTORICAL  CHIEF COMPLAINT:  Chief Complaint  Patient presents with  . Follow-up  . Migraine    due to shuffle between home and school, lost topiramate and did not take.  States that was getting 3-4/wk, but now 3-4/month.  Trigger less sleep, and stress( in school).    HISTORY OF PRESENT ILLNESS:   UPDATE (12/17/17, [facility]): Since last visit, doing well. Tolerating rizatriptan, which helps. Never filled topiramate (lost rx). Never got called about MRI scan apparently. Lack of sleep is aggravating factor for HA (avg 3-5 hrs per night during school). Now getting 8-10 hours per night.   PRIOR HPI (03/22/17): 22 year old female with history of asthma and PCOS, here for evaluation of headaches.  Patient has long history of headaches from second grade, with pain in her eyes, forehead, photophobia, nausea. She has bilateral pounding and sharp pain. Triggering factors include certain smells, especially things that remind her of her childhood. Headaches affect her quality of sleep. Patient having 1-4 headaches per week, lasting hours at a time. Patient has never officially been diagnosed with migraine headaches. No warning or aura symptoms. She takes over-the-counter medications such as Aleve without relief. Patient's mother has migraine headaches.    REVIEW OF SYSTEMS: Full 14 system review of systems performed and negative with exception of: headache nausea insomnia.   ALLERGIES: No Known Allergies  HOME MEDICATIONS: Outpatient Medications Prior to Visit  Medication Sig Dispense Refill  . albuterol (PROVENTIL HFA;VENTOLIN HFA) 108 (90 Base) MCG/ACT inhaler Inhale 2 puffs into the lungs every 4 hours as needed to treat wheezes, cough, shortness of breath 1 Inhaler 1  . escitalopram (LEXAPRO) 10 MG tablet Take 1 tablet  (10 mg total) by mouth daily. 90 tablet 1  . rizatriptan (MAXALT-MLT) 10 MG disintegrating tablet Take 1 tablet (10 mg total) by mouth as needed for migraine. May repeat in 2 hours if needed 9 tablet 11  . topiramate (TOPAMAX) 50 MG tablet Take 1 tablet (50 mg total) by mouth 2 (two) times daily. (Patient not taking: Reported on 05/31/2017) 60 tablet 12  . cetirizine (ZYRTEC) 10 MG tablet Take 1 tablet (10 mg total) by mouth daily. (Patient not taking: Reported on 05/17/2017) 30 tablet 6  . hydrOXYzine (VISTARIL) 25 MG capsule Take 1 capsule (25 mg total) by mouth 3 (three) times daily as needed. (Patient not taking: Reported on 05/17/2017) 30 capsule 0  . montelukast (SINGULAIR) 10 MG tablet Take 1 tablet (10 mg total) by mouth at bedtime. (Patient not taking: Reported on 05/17/2017) 30 tablet 0   No facility-administered medications prior to visit.     PAST MEDICAL HISTORY: Past Medical History:  Diagnosis Date  . Allergic rhinitis   . Asthma   . Headache   . PCOS (polycystic ovarian syndrome)     PAST SURGICAL HISTORY: Past Surgical History:  Procedure Laterality Date  . NO PAST SURGERIES    . WISDOM TOOTH EXTRACTION      FAMILY HISTORY: Family History  Problem Relation Age of Onset  . Diabetes Father   . Hypertension Father   . Other Mother        hypoglycemia  . Diabetes Paternal Aunt   . Diabetes Paternal Uncle   . Kidney disease Maternal Aunt     SOCIAL HISTORY:  Social History   Socioeconomic History  .  Marital status: Single    Spouse name: Not on file  . Number of children: 0  . Years of education: Not on file  . Highest education level: Not on file  Occupational History  . Occupation: Consulting civil engineerstudent    Comment: [employer]  Social Needs  . Financial resource strain: Not on file  . Food insecurity:    Worry: Not on file    Inability: Not on file  . Transportation needs:    Medical: Not on file    Non-medical: Not on file  Tobacco Use  . Smoking status:  Never Smoker  . Smokeless tobacco: Never Used  Substance and Sexual Activity  . Alcohol use: No  . Drug use: No  . Sexual activity: Never  Lifestyle  . Physical activity:    Days per week: Not on file    Minutes per session: Not on file  . Stress: Not on file  Relationships  . Social connections:    Talks on phone: Not on file    Gets together: Not on file    Attends religious service: Not on file    Active member of club or organization: Not on file    Attends meetings of clubs or organizations: Not on file    Relationship status: Not on file  . Intimate partner violence:    Fear of current or ex partner: Not on file    Emotionally abused: Not on file    Physically abused: Not on file    Forced sexual activity: Not on file  Other Topics Concern  . Not on file  Social History Narrative   03/22/17 [place], [employer] [facility]   Caffeine- coffee, 1 cup 3 x weekly     PHYSICAL EXAM  GENERAL EXAM/CONSTITUTIONAL: Vitals:  Vitals:   12/17/17 0909  BP: 105/66  Pulse: 68  Weight: 228 lb 3.2 oz (103.5 kg)  Height: 4' 11.5" (1.511 m)   Body mass index is 45.32 kg/m. No exam data present  Patient is in no distress; well developed, nourished and groomed; neck is supple  CARDIOVASCULAR:  Examination of carotid arteries is normal; no carotid bruits  Regular rate and rhythm, no murmurs  Examination of peripheral vascular system by observation and palpation is normal  EYES:  Ophthalmoscopic exam of optic discs and posterior segments is normal; no papilledema or hemorrhages  MUSCULOSKELETAL:  Gait, strength, tone, movements noted in Neurologic exam below  NEUROLOGIC: MENTAL STATUS:  No flowsheet data found.  awake, alert, oriented to person, place and time  recent and remote memory intact  normal attention and concentration  language fluent, comprehension intact, naming intact,   fund of knowledge appropriate  CRANIAL NERVE:   2nd - no papilledema  on fundoscopic exam  2nd, 3rd, 4th, 6th - pupils equal and reactive to light, visual fields full to confrontation, extraocular muscles intact, no nystagmus  5th - facial sensation symmetric  7th - facial strength symmetric  8th - hearing intact  9th - palate elevates symmetrically, uvula midline  11th - shoulder shrug symmetric  12th - tongue protrusion midline  MOTOR:   normal bulk and tone, full strength in the BUE, BLE  SENSORY:   normal and symmetric to light touch, temperature, vibration  COORDINATION:   finger-nose-finger, fine finger movements normal  REFLEXES:   deep tendon reflexes present and symmetric  GAIT/STATION:   narrow based gait    DIAGNOSTIC DATA (LABS, IMAGING, TESTING) - I reviewed patient records, labs, notes, testing  and imaging myself where available.  Lab Results  Component Value Date   WBC 5.8 03/09/2016   HGB 12.4 03/09/2016   HCT 38.0 03/09/2016   MCV 88.4 03/09/2016   PLT 338 03/09/2016      Component Value Date/Time   NA 139 05/07/2016 1154   K 4.1 05/07/2016 1154   CL 105 05/07/2016 1154   CO2 24 05/07/2016 1154   GLUCOSE 87 05/07/2016 1154   BUN 7 05/07/2016 1154   CREATININE 0.61 05/07/2016 1154   CALCIUM 9.3 05/07/2016 1154   PROT 6.9 05/07/2016 1154   ALBUMIN 3.9 05/07/2016 1154   AST 13 05/07/2016 1154   ALT 11 05/07/2016 1154   ALKPHOS 82 05/07/2016 1154   BILITOT 0.2 05/07/2016 1154   Lab Results  Component Value Date   CHOL 180 (H) 03/09/2016   HDL 75 03/09/2016   LDLCALC 90 03/09/2016   TRIG 74 03/09/2016   CHOLHDL 2.4 03/09/2016   Lab Results  Component Value Date   HGBA1C 5.2 08/10/2016   No results found for: [id number] Lab Results  Component Value Date   TSH 0.77 05/07/2016       ASSESSMENT AND PLAN  22 y.o. year old female here with Migraine without aura, since second grade. Stable on rizatriptan.    Dx:  1. Migraine without aura and without status migrainosus, not intractable        PLAN:  I spent 25 minutes of face to face time with patient. Greater than 50% of time was spent in counseling and coordination of care with patient. In summary we discussed:   - continue rizatriptan 10mg  as needed for breakthrough headache; may repeat x 1 after 2 hours; max 2 tabs per day or 8 per month  - To prevent or relieve headaches, try the following:   Cool Compress. Lie down and place a cool compress on your head.   Avoid headache triggers. If certain foods or odors seem to have triggered your migraines in the past, avoid them. A headache diary might help you identify triggers.   Include physical activity in your daily routine.   Manage stress. Find healthy ways to cope with the stressors, such as delegating tasks on your to-do list.   Practice relaxation techniques. Try deep breathing, yoga, massage and visualization.   Eat regularly. Eating regularly scheduled meals and maintaining a healthy diet might help prevent headaches. Also, drink plenty of fluids.   Follow a regular sleep schedule. Sleep deprivation might contribute to headaches  Consider biofeedback. With this mind-body technique, you learn to control certain bodily functions - such as muscle tension, heart rate and blood pressure - to prevent headaches or reduce headache pain.  Meds ordered this encounter  Medications  . rizatriptan (MAXALT-MLT) 10 MG disintegrating tablet    Sig: Take 1 tablet (10 mg total) by mouth as needed for migraine. May repeat in 2 hours if needed    Dispense:  9 tablet    Refill:  11   Return in about 8 months (around 08/18/2018) for with NP.    Suanne Marker, MD 12/17/2017, 9:43 AM Certified in Neurology, Neurophysiology and Neuroimaging  [facility] [address] [place], [place] [address] [phone]

## 2018-02-04 ENCOUNTER — Encounter: Payer: Self-pay | Admitting: Nurse Practitioner

## 2018-08-22 ENCOUNTER — Ambulatory Visit: Payer: Self-pay | Admitting: Nurse Practitioner

## 2019-02-17 ENCOUNTER — Telehealth: Payer: BLUE CROSS/BLUE SHIELD | Admitting: Physician Assistant

## 2019-02-17 DIAGNOSIS — R0602 Shortness of breath: Secondary | ICD-10-CM

## 2019-02-17 NOTE — Progress Notes (Signed)
E-Visit for Corona Virus Screening   Your current symptoms could be consistent with the coronavirus.  Many health care providers can now test patients at their office but not all are.  [facility] has multiple testing sites. For information on our COVID testing locations and hours go to [email or web address]  Please quarantine yourself while awaiting your test results.  We are enrolling you in our [facility] for [place] . Daily you will receive a questionnaire within the [facility] website. Our COVID 19 response team willl be monitoriing your responses daily.    COVID-19 is a respiratory illness with symptoms that are similar to the flu. Symptoms are typically mild to moderate, but there have been cases of severe illness and death due to the virus. The following symptoms may appear 2-14 days after exposure: . Fever . Cough . Shortness of breath or difficulty breathing . Chills . Repeated shaking with chills . Muscle pain . Headache . Sore throat . New loss of taste or smell . Fatigue . Congestion or runny nose . Nausea or vomiting . Diarrhea  It is vitally important that if you feel that you have an infection such as this virus or any other virus that you stay home and away from places where you may spread it to others.  You should self-quarantine for 14 days if you have symptoms that could potentially be coronavirus or have been in close contact a with a person diagnosed with COVID-19 within the last 2 weeks. You should avoid contact with people age 97 and older.   You should wear a mask or cloth face covering over your nose and mouth if you must be around other people or animals, including pets (even at home). Try to stay at least 6 feet away from other people. This will protect the people around you.  You can use medication such as A prescription inhaler called Albuterol MDI 90 mcg /actuation 2 puffs every 4 hours as needed for shortness of breath,  wheezing, cough  You may also take acetaminophen (Tylenol) as needed for fever.   Reduce your risk of any infection by using the same precautions used for avoiding the common cold or flu:  Marland Kitchen Wash your hands often with soap and warm water for at least 20 seconds.  If soap and water are not readily available, use an alcohol-based hand sanitizer with at least 60% alcohol.  . If coughing or sneezing, cover your mouth and nose by coughing or sneezing into the elbow areas of your shirt or coat, into a tissue or into your sleeve (not your hands). . Avoid shaking hands with others and consider head nods or verbal greetings only. . Avoid touching your eyes, nose, or mouth with unwashed hands.  . Avoid close contact with people who are sick. . Avoid places or events with large numbers of people in one location, like concerts or sporting events. . Carefully consider travel plans you have or are making. . If you are planning any travel outside or inside the [place], visit the CDC's Travelers' Health webpage for the latest health notices. . If you have some symptoms but not all symptoms, continue to monitor at home and seek medical attention if your symptoms worsen. . If you are having a medical emergency, call 911.  HOME CARE . Only take medications as instructed by your medical team. . Drink plenty of fluids and get plenty of rest. . A steam or ultrasonic humidifier can help if you have congestion.  GET HELP RIGHT AWAY IF YOU HAVE EMERGENCY WARNING SIGNS** FOR COVID-19. If you or someone is showing any of these signs seek emergency medical care immediately. Call 911 or proceed to your closest emergency facility if: . You develop worsening high fever. . Trouble breathing . Bluish lips or face . Persistent pain or pressure in the chest . New confusion . Inability to wake or stay awake . You cough up blood. . Your symptoms become more severe  **This list is not all possible symptoms. Contact your  medical provider for any symptoms that are sever or concerning to you.   MAKE SURE YOU   Understand these instructions.  Will watch your condition.  Will get help right away if you are not doing well or get worse.  Your e-visit answers were reviewed by a board certified advanced clinical practitioner to complete your personal care plan.  Depending on the condition, your plan could have included both over the counter or prescription medications.  If there is a problem please reply once you have received a response from your provider.  Your safety is important to [place].  If you have drug allergies check your prescription carefully.    You can use MyChart to ask questions about today's visit, request a non-urgent call back, or ask for a work or school excuse for 24 hours related to this e-Visit. If it has been greater than 24 hours you will need to follow up with your provider, or enter a new e-Visit to address those concerns. You will get an e-mail in the next two days asking about your experience.  I hope that your e-visit has been valuable and will speed your recovery. Thank you for using e-visits.   Greater than 5 minutes, yet less than 10 minutes of time have been spent researching, coordinating and implementing care for this patient today.

## 2019-02-17 NOTE — Addendum Note (Signed)
Addended by: Dorise Hiss on: 02/17/2019 04:06 PM   Modules accepted: Orders

## 2019-02-18 ENCOUNTER — Other Ambulatory Visit: Payer: Self-pay

## 2019-02-18 DIAGNOSIS — Z20822 Contact with and (suspected) exposure to covid-19: Secondary | ICD-10-CM

## 2019-02-19 ENCOUNTER — Encounter (INDEPENDENT_AMBULATORY_CARE_PROVIDER_SITE_OTHER): Payer: Self-pay

## 2019-02-19 ENCOUNTER — Telehealth: Payer: Self-pay | Admitting: *Deleted

## 2019-02-19 LAB — NOVEL CORONAVIRUS, NAA: SARS-CoV-2, NAA: NOT DETECTED

## 2019-02-19 NOTE — Telephone Encounter (Signed)
Contacted pt due to MyChart [facility]ion response 02/19/2019; the pt says that she woke up with nausea, and stomach ache this am; she is not wanting food or beverages; recommendations made:  Sip water or rehydration liquid (Gatorade or Powerade);* Other options: 1/2 strength flat lemon-lime soda or ginger ale; pt also advised to report signs of dehydration; she was also advised to contact her PCP for additional recommendations; she verbalized understanding.

## 2019-04-20 IMAGING — RF DG UGI W/ HIGH DENSITY W/KUB
9 of 12 series · 13 of 18 positions shown · non-contrast
Comparison: Ultrasound same date

CLINICAL DATA: Vomiting with nausea.

EXAM:
UPPER GI SERIES WITH KUB
TECHNIQUE: After obtaining a scout radiograph a routine upper GI series was
performed using thin and high density barium.
FLUOROSCOPY TIME:  Fluoroscopy Time:  3 minutes and 10 seconds
Radiation Exposure Index (if provided by the fluoroscopic device):
Not applicable.
Number of Acquired Spot Images: 1

[Series 1: t abdomen supine · 0.15mm/px · 1 of 1 slices shown]
[im 1/1]
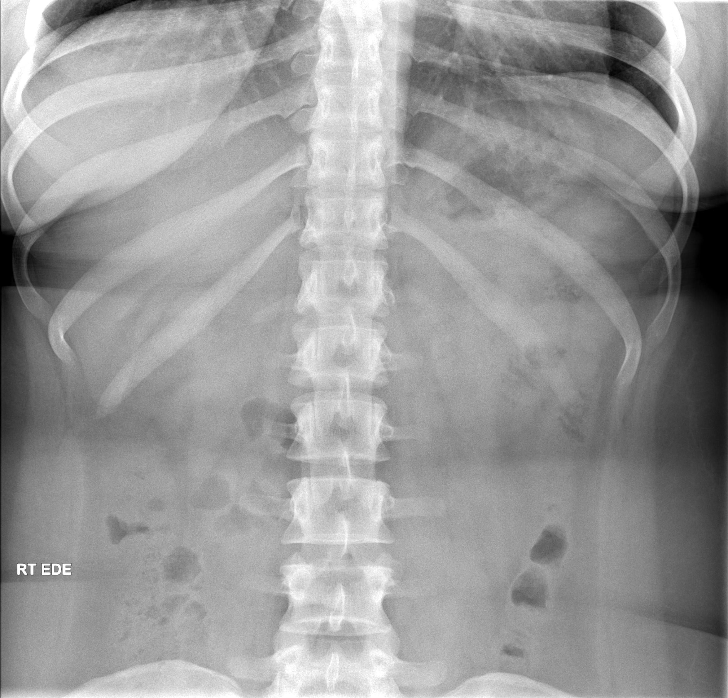

[Series 3: cp_standard · 0.26mm/px · 1 of 1 slices shown (1 of 8)]
[im 1/1]
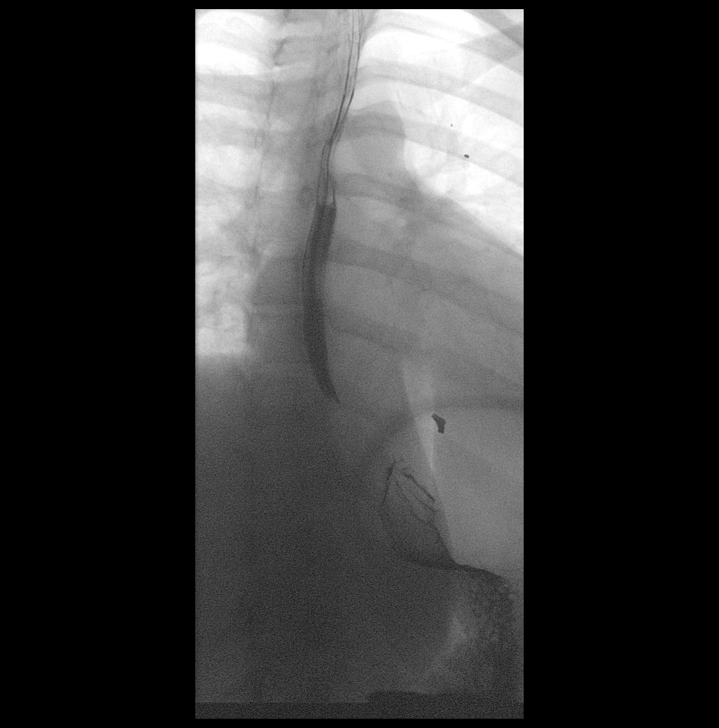

[Series 4: cp_standard · 0.20mm/px · 1 of 1 slices shown (2 of 8)]
[im 1/1]
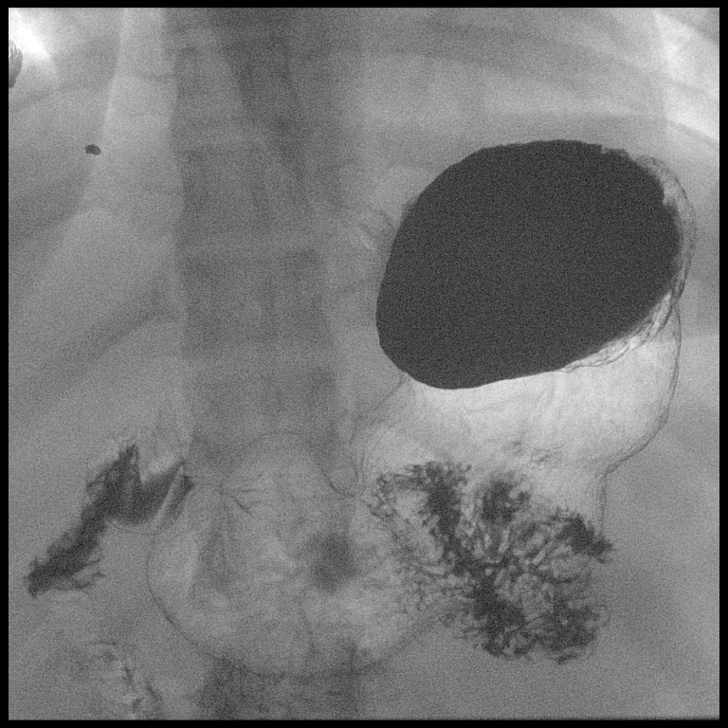

[Series 5: cp_standard · 0.20mm/px · 1 of 1 slices shown (3 of 8)]
[im 1/1]
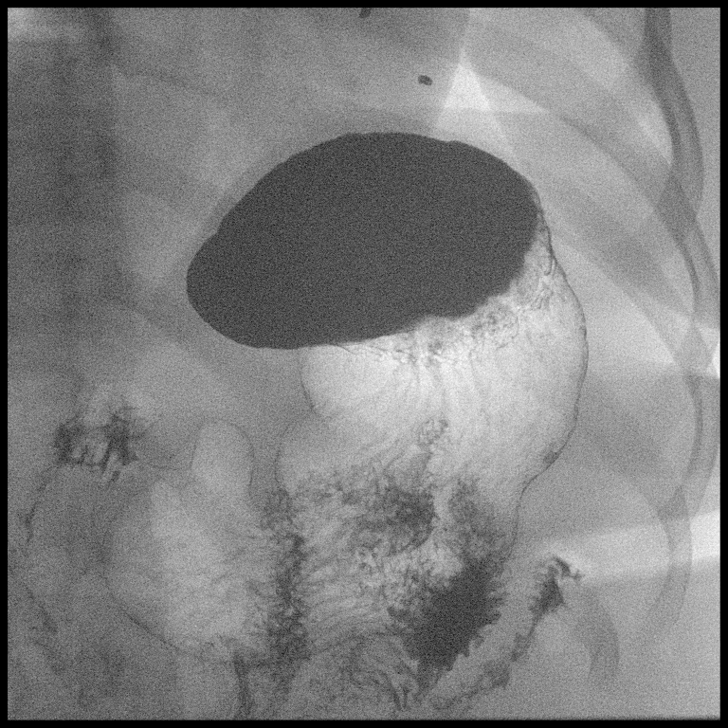

[Series 7: cp_standard · 0.21mm/px · 1 of 1 slices shown (4 of 8)]
[im 1/1]
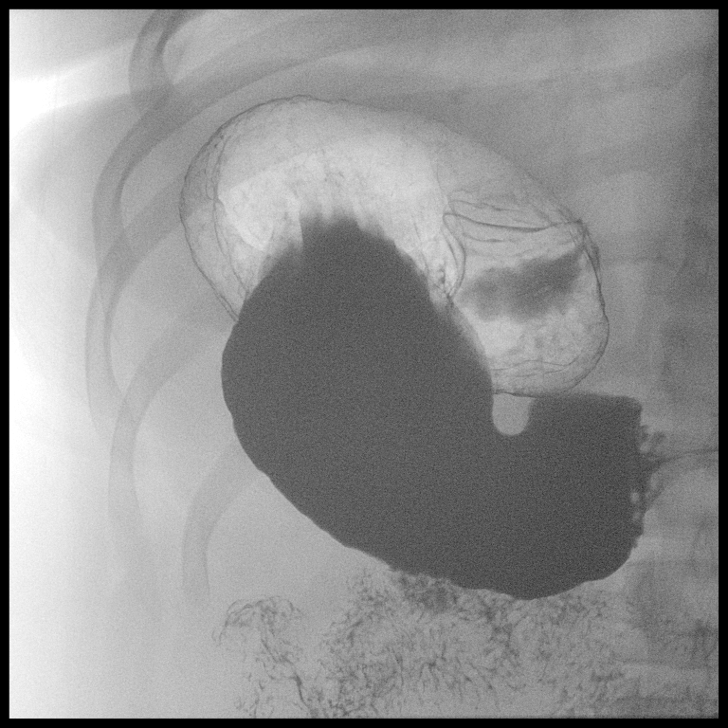

[Series 8: cp_standard · 0.21mm/px · 1 of 1 slices shown (5 of 8)]
[im 1/1]
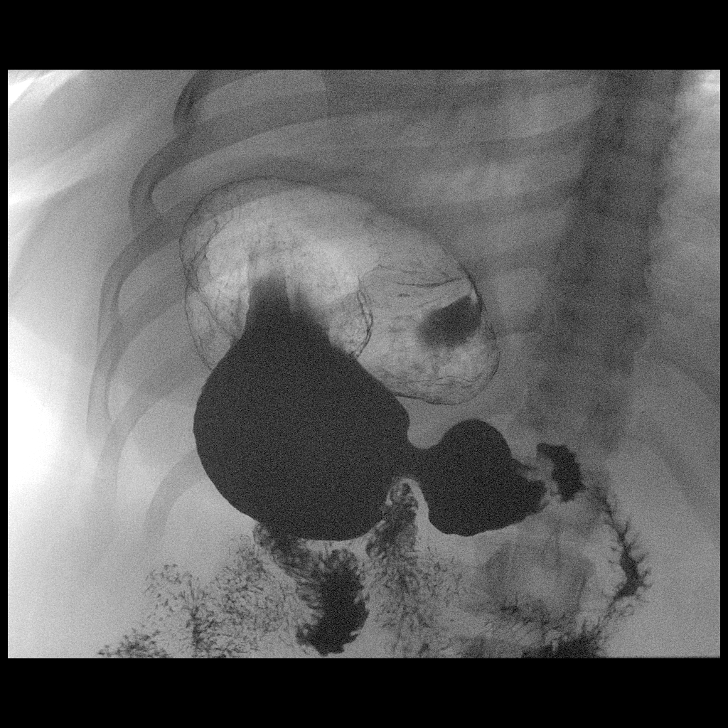

[Series 10: cp_standard · 0.21mm/px · 1 of 1 slices shown (6 of 8)]
[im 1/1]
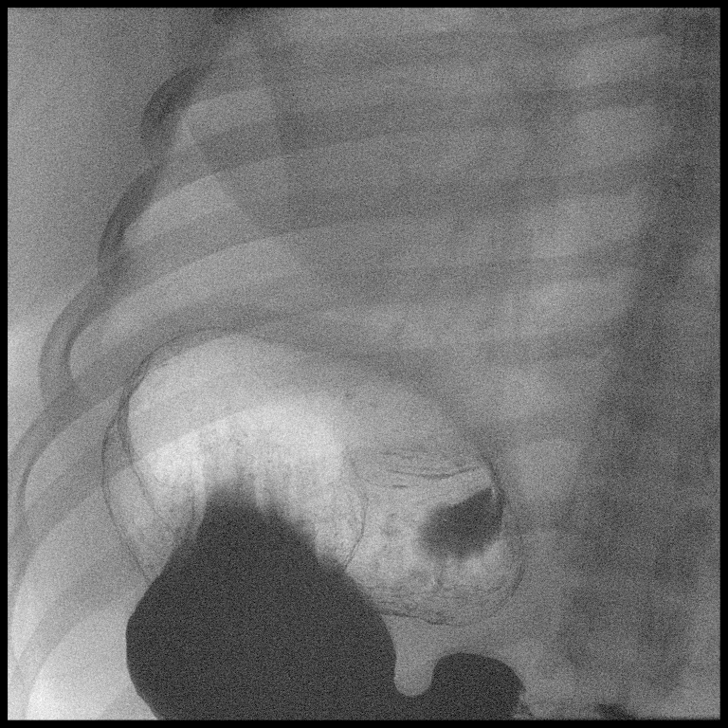

[Series 11: cp_standard · 0.65mm/px · 3 of 86 frames shown (7 of 8)]
[frame 13/86]
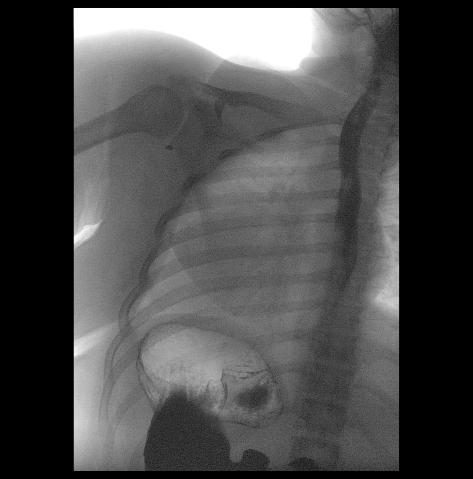
[frame 39/86]
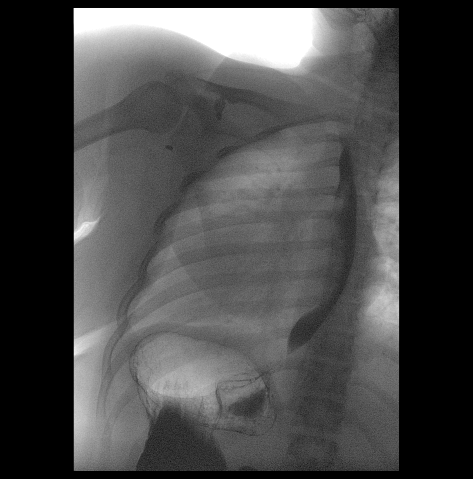
[frame 74/86]
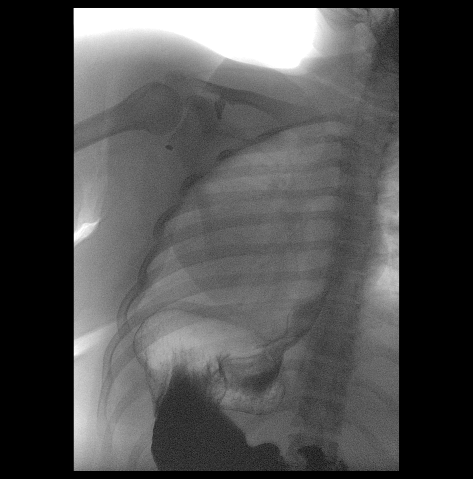

[Series 12: cp_standard · 0.63mm/px · 3 of 218 frames shown (8 of 8)]
[frame 33/218]
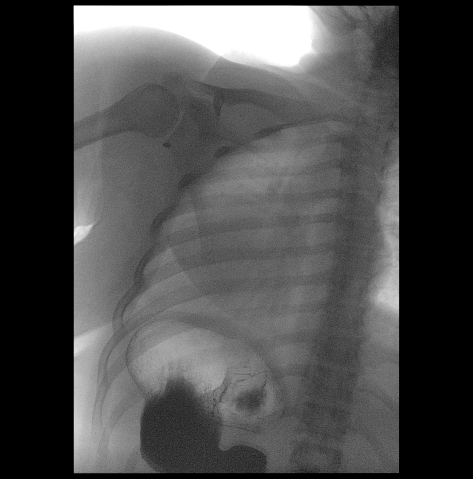
[frame 110/218]
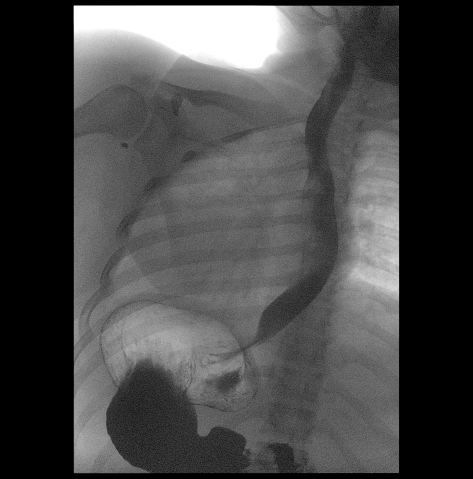
[frame 186/218]
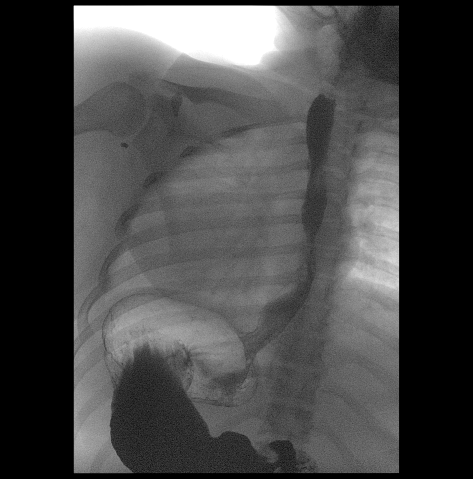

[13 of 18 positions shown; findings below may reference images not displayed]

FINDINGS: Preprocedure scout film  is unremarkable.

Double contrast evaluation esophagus demonstrates no mucosal
abnormality.

Normal double contrast appearance of the stomach, without ulcer or
mass. Prompt passage of contrast into the duodenal bulb and C-loop.
No evidence of gastric outlet obstruction. Normal appearance of the
duodenal bulb and C-loop.

Evaluation of primary peristalsis demonstrates a normal primary
peristaltic wave on each swallow.

Full column evaluation of the esophagus demonstrates no persistent
narrowing or stricture.
IMPRESSION: Normal upper GI.

## 2020-02-04 ENCOUNTER — Encounter: Payer: Self-pay | Admitting: Pediatrics

## 2020-07-18 ENCOUNTER — Encounter (INDEPENDENT_AMBULATORY_CARE_PROVIDER_SITE_OTHER): Payer: Self-pay

## 2020-07-25 ENCOUNTER — Other Ambulatory Visit: Payer: Self-pay

## 2020-07-25 ENCOUNTER — Ambulatory Visit (INDEPENDENT_AMBULATORY_CARE_PROVIDER_SITE_OTHER): Payer: 59 | Admitting: Family Medicine

## 2020-07-25 ENCOUNTER — Encounter (INDEPENDENT_AMBULATORY_CARE_PROVIDER_SITE_OTHER): Payer: Self-pay | Admitting: Family Medicine

## 2020-07-25 VITALS — BP 119/80 | HR 69 | Temp 98.1°F | Ht 60.0 in | Wt 244.0 lb

## 2020-07-25 DIAGNOSIS — F32A Depression, unspecified: Secondary | ICD-10-CM

## 2020-07-25 DIAGNOSIS — F4323 Adjustment disorder with mixed anxiety and depressed mood: Secondary | ICD-10-CM

## 2020-07-25 DIAGNOSIS — R0602 Shortness of breath: Secondary | ICD-10-CM | POA: Diagnosis not present

## 2020-07-25 DIAGNOSIS — F419 Anxiety disorder, unspecified: Secondary | ICD-10-CM

## 2020-07-25 DIAGNOSIS — E559 Vitamin D deficiency, unspecified: Secondary | ICD-10-CM | POA: Diagnosis not present

## 2020-07-25 DIAGNOSIS — Z9189 Other specified personal risk factors, not elsewhere classified: Secondary | ICD-10-CM

## 2020-07-25 DIAGNOSIS — E282 Polycystic ovarian syndrome: Secondary | ICD-10-CM

## 2020-07-25 DIAGNOSIS — R5383 Other fatigue: Secondary | ICD-10-CM | POA: Diagnosis not present

## 2020-07-25 DIAGNOSIS — Z6841 Body Mass Index (BMI) 40.0 and over, adult: Secondary | ICD-10-CM

## 2020-07-25 DIAGNOSIS — Z0289 Encounter for other administrative examinations: Secondary | ICD-10-CM

## 2020-07-25 MED ORDER — ESCITALOPRAM OXALATE 10 MG PO TABS: 10.0000 mg | ORAL_TABLET | Freq: Every day | ORAL | 0 refills | Status: DC

## 2020-07-25 MED ORDER — BUSPIRONE HCL 7.5 MG PO TABS: 7.5000 mg | ORAL_TABLET | Freq: Three times a day (TID) | ORAL | 0 refills | Status: DC | PRN

## 2020-07-26 LAB — COMPREHENSIVE METABOLIC PANEL
ALT: 18 IU/L (ref 0–32)
AST: 19 IU/L (ref 0–40)
Albumin/Globulin Ratio: 1.4 (ref 1.2–2.2)
Albumin: 4.3 g/dL (ref 3.9–5.0)
Alkaline Phosphatase: 92 IU/L (ref 44–121)
BUN/Creatinine Ratio: 15 (ref 9–23)
BUN: 10 mg/dL (ref 6–20)
Bilirubin Total: <0.2 mg/dL (ref 0.0–1.2)
CO2: 22 mmol/L (ref 20–29)
Calcium: 9.3 mg/dL (ref 8.7–10.2)
Chloride: 105 mmol/L (ref 96–106)
Creatinine, Ser: 0.65 mg/dL (ref 0.57–1.00)
GFR calc Af Amer: 146 mL/min/{1.73_m2} (ref >59)
GFR calc non Af Amer: 126 mL/min/{1.73_m2} (ref >59)
Globulin, Total: 3 g/dL (ref 1.5–4.5)
Glucose: 95 mg/dL (ref 65–99)
Potassium: 5 mmol/L (ref 3.5–5.2)
Sodium: 142 mmol/L (ref 134–144)
Total Protein: 7.3 g/dL (ref 6.0–8.5)

## 2020-07-26 LAB — HEMOGLOBIN A1C
Est. average glucose Bld gHb Est-mCnc: 111 mg/dL
Hgb A1c MFr Bld: 5.5 % (ref 4.8–5.6)

## 2020-07-26 LAB — CBC WITH DIFFERENTIAL/PLATELET
Basophils Absolute: 0 10*3/uL (ref 0.0–0.2)
Basos: 1 %
EOS (ABSOLUTE): 0.2 10*3/uL (ref 0.0–0.4)
Eos: 4 %
Hematocrit: 38.9 % (ref 34.0–46.6)
Hemoglobin: 12.6 g/dL (ref 11.1–15.9)
Immature Grans (Abs): 0 10*3/uL (ref 0.0–0.1)
Immature Granulocytes: 1 %
Lymphocytes Absolute: 1.6 10*3/uL (ref 0.7–3.1)
Lymphs: 38 %
MCH: 29 pg (ref 26.6–33.0)
MCHC: 32.4 g/dL (ref 31.5–35.7)
MCV: 90 fL (ref 79–97)
Monocytes Absolute: 0.3 10*3/uL (ref 0.1–0.9)
Monocytes: 7 %
Neutrophils Absolute: 2 10*3/uL (ref 1.4–7.0)
Neutrophils: 49 %
Platelets: 361 10*3/uL (ref 150–450)
RBC: 4.34 x10E6/uL (ref 3.77–5.28)
RDW: 12.6 % (ref 11.7–15.4)
WBC: 4 10*3/uL (ref 3.4–10.8)

## 2020-07-26 LAB — LIPID PANEL WITH LDL/HDL RATIO
Cholesterol, Total: 205 mg/dL — ABNORMAL HIGH (ref 100–199)
HDL: 71 mg/dL (ref >39)
LDL Chol Calc (NIH): 122 mg/dL — ABNORMAL HIGH (ref 0–99)
LDL/HDL Ratio: 1.7 ratio (ref 0.0–3.2)
Triglycerides: 66 mg/dL (ref 0–149)
VLDL Cholesterol Cal: 12 mg/dL (ref 5–40)

## 2020-07-26 LAB — TSH: TSH: 1.51 u[IU]/mL (ref 0.450–4.500)

## 2020-07-26 LAB — VITAMIN B12: Vitamin B-12: 466 pg/mL (ref 232–1245)

## 2020-07-26 LAB — INSULIN, RANDOM: INSULIN: 23.1 u[IU]/mL (ref 2.6–24.9)

## 2020-07-26 LAB — T4: T4, Total: 6.7 ug/dL (ref 4.5–12.0)

## 2020-07-26 LAB — VITAMIN D 25 HYDROXY (VIT D DEFICIENCY, FRACTURES): Vit D, 25-Hydroxy: 17 ng/mL — ABNORMAL LOW (ref 30.0–100.0)

## 2020-07-26 LAB — FOLATE: Folate: 5.1 ng/mL (ref >3.0)

## 2020-07-26 LAB — T3: T3, Total: 116 ng/dL (ref 71–180)

## 2020-07-27 NOTE — Progress Notes (Signed)
Chief Complaint:   OBESITY Kristin Blanchard (MR# [id number]) is a 25 y.o. female who presents for evaluation and treatment of obesity and related comorbidities. Current BMI is Body mass index is 47.65 kg/m. Kristin Blanchard has been struggling with her weight for many years and has been unsuccessful in either losing weight, maintaining weight loss, or reaching her healthy weight goal.  Kristin Blanchard is currently in the action stage of change and ready to dedicate time achieving and maintaining a healthier weight. Kristin Blanchard is interested in becoming our patient and working on intensive lifestyle modifications including (but not limited to) diet and exercise for weight loss.  Kristin Blanchard heard about the clinic from her mom. She has struggled with her relationship with food for years. Previously eating and logging about 1200 cal/day. She has coffee in the AM- specialty latte (vanilla) and danish (feels satisfied); 2nd meal- 2.5 oz protein, 1/2 cup rice, 1/2 cup vegetable (feels satisfied); maybe eat a snack if hungry.  Kristin Blanchard's habits were reviewed today and are as follows: Her family eats meals together, her desired weight loss is 94 lbs, she has been heavy most of her life, she started gaining weight in college, her heaviest weight ever was 240 pounds, she is a picky eater and doesn't like to eat healthier foods, she has significant food cravings issues, she snacks frequently in the evenings, she skips meals frequently, she is frequently drinking liquids with calories, she frequently makes poor food choices, she has problems with excessive hunger and she struggles with emotional eating.  Depression Screen Kristin Blanchard's Food and Mood (modified PHQ-9) score was 12.  Depression screen PHQ 2/9 07/25/2020  Decreased Interest 1  Down, Depressed, Hopeless 2  PHQ - 2 Score 3  Altered sleeping 1  Tired, decreased energy 2  Change in appetite 3  Feeling bad or failure about yourself  1  Trouble concentrating 2  Moving slowly or  fidgety/restless 0  Suicidal thoughts 0  PHQ-9 Score 12  Difficult doing work/chores Not difficult at all   Subjective:   1. Other fatigue Kristin Blanchard admits to daytime somnolence and admits to waking up still tired. Patent has a history of symptoms of morning headache. Kristin Blanchard generally gets 8 hours of sleep per night, and states that she has poor sleep quality. Snoring is not present. Apneic episodes are not present. Epworth Sleepiness Score is 3. EKG normal sinus rhythm at 80 bpm.  2. SOB (shortness of breath) on exertion Kristin Blanchard notes increasing shortness of breath with exercising and seems to be worsening over time with weight gain. She notes getting out of breath sooner with activity than she used to. This has gotten worse recently. Kristin Blanchard denies shortness of breath at rest or orthopnea. EKG normal sinus rhythm at 80 bpm.  3. PCOS (polycystic ovarian syndrome) Historical diagnosis, as Kristin Blanchard was diagnosed years ago.  4. Vitamin D deficiency 5 years ago, Kristin Blanchard had a Vit D level of 14. She is not on supplementation. She notes fatigue.  5. Anxiety and depression Pt denies suicidal or homicidal ideations. Her anxiety is not the best controlled right now. Pt states she has mild depression currently. She was previously on Lexapro. She had a less than desirable experience with psych on campus.  6. At risk for diabetes mellitus Kristin Blanchard is at higher than average risk for developing diabetes due to obesity.   Assessment/Plan:   1. Other fatigue Kristin Blanchard does feel that her weight is causing her energy to be lower than it should be.  Fatigue may be related to obesity, depression or many other causes. Labs will be ordered, and in the meanwhile, Kristin Blanchard will focus on self care including making healthy food choices, increasing physical activity and focusing on stress reduction. Check labs today.  - EKG 12-Lead - Vitamin B12 - Comprehensive metabolic panel - Folate - T3 - T4 - TSH - CBC with Differential/Platelet -  Lipid Panel With LDL/HDL Ratio  2. SOB (shortness of breath) on exertion [name] does feel that she gets out of breath more easily that she used to when she exercises. Kristin Blanchard's shortness of breath appears to be obesity related and exercise induced. She has agreed to work on weight loss and gradually increase exercise to treat her exercise induced shortness of breath. Will continue to monitor closely.  3. PCOS (polycystic ovarian syndrome) Check labs today.  - Hemoglobin A1c - Insulin, random  4. Vitamin D deficiency Low Vitamin D level contributes to fatigue and are associated with obesity, breast, and colon cancer. She agrees to follow-up for routine testing of Vitamin D, at least 2-3 times per year to avoid over-replacement. Check labs today.  - VITAMIN D 25 Hydroxy (Vit-D Deficiency, Fractures)  5. Anxiety and depression Behavior modification techniques were discussed today to help Kristin Blanchard deal with her anxiety.  Orders and follow up as documented in patient record. Follow up with counselor. Medications, as per below.  - escitalopram (LEXAPRO) 10 MG tablet; Take 1 tablet (10 mg total) by mouth daily.  Dispense: 30 tablet; Refill: 0 - busPIRone (BUSPAR) 7.5 MG tablet; Take 1 tablet (7.5 mg total) by mouth 3 (three) times daily as needed (anxiety).  Dispense: 30 tablet; Refill: 0  6. At risk for diabetes mellitus Kristin Blanchard was given approximately 15 minutes of diabetes education and counseling today. We discussed intensive lifestyle modifications today with an emphasis on weight loss as well as increasing exercise and decreasing simple carbohydrates in her diet. We also reviewed medication options with an emphasis on risk versus benefit of those discussed.   Repetitive spaced learning was employed today to elicit superior memory formation and behavioral change.  7. Class 3 severe obesity with serious comorbidity and body mass index (BMI) of 45.0 to 49.9 in adult, unspecified obesity type (HCC) Kristin Blanchard  is currently in the action stage of change and her goal is to continue with weight loss efforts. I recommend Kristin Blanchard begin the structured treatment plan as follows:  She has agreed to the Category 2 Plan + 100 cal.  Exercise goals: No exercise has been prescribed at this time.   Behavioral modification strategies: increasing lean protein intake, meal planning and cooking strategies, keeping healthy foods in the home and planning for success.  She was informed of the importance of frequent follow-up visits to maximize her success with intensive lifestyle modifications for her multiple health conditions. She was informed we would discuss her lab results at her next visit unless there is a critical issue that needs to be addressed sooner. Kristin Blanchard agreed to keep her next visit at the agreed upon time to discuss these results.  Objective:   Blood pressure 119/80, pulse 69, temperature 98.1 F (36.7 C), temperature source Oral, height 5' (1.524 m), weight 244 lb (110.7 kg), last menstrual period 07/12/2020, SpO2 98 %. Body mass index is 47.65 kg/m.  EKG: Normal sinus rhythm, rate 80.  Indirect Calorimeter completed today shows a VO2 of 240 and a REE of 1667.  Her calculated basal metabolic rate is 7564 thus her basal metabolic  rate is worse than expected.  General: Cooperative, alert, well developed, in no acute distress. HEENT: Conjunctivae and lids unremarkable. Cardiovascular: Regular rhythm.  Lungs: Normal work of breathing. Neurologic: No focal deficits.   Lab Results  Component Value Date   CREATININE 0.65 07/25/2020   BUN 10 07/25/2020   NA 142 07/25/2020   K 5.0 07/25/2020   CL 105 07/25/2020   CO2 22 07/25/2020   Lab Results  Component Value Date   ALT 18 07/25/2020   AST 19 07/25/2020   ALKPHOS 92 07/25/2020   BILITOT <0.2 07/25/2020   Lab Results  Component Value Date   HGBA1C 5.5 07/25/2020   HGBA1C 5.2 08/10/2016   HGBA1C 5.1 03/09/2016   HGBA1C 5.2 11/15/2014   Lab  Results  Component Value Date   INSULIN 23.1 07/25/2020   Lab Results  Component Value Date   TSH 1.510 07/25/2020   Lab Results  Component Value Date   CHOL 205 (H) 07/25/2020   HDL 71 07/25/2020   LDLCALC 122 (H) 07/25/2020   TRIG 66 07/25/2020   CHOLHDL 2.4 03/09/2016   Lab Results  Component Value Date   WBC 4.0 07/25/2020   HGB 12.6 07/25/2020   HCT 38.9 07/25/2020   MCV 90 07/25/2020   PLT 361 07/25/2020    Attestation Statements:   Reviewed by clinician on day of visit: allergies, medications, problem list, medical history, surgical history, family history, social history, and previous encounter notes.  Edmund Hilda, am acting as transcriptionist for Reuben Likes, MD.  This is the patient's first visit at [facility]. The patient's NEW PATIENT PACKET was reviewed at length. Included in the packet: current and past health history, medications, allergies, ROS, gynecologic history (women only), surgical history, family history, social history, weight history, weight loss surgery history (for those that have had weight loss surgery), nutritional evaluation, mood and food questionnaire, PHQ9, Epworth questionnaire, sleep habits questionnaire, patient life and health improvement goals questionnaire. These will all be scanned into the patient's chart under media.   During the visit, I independently reviewed the patient's EKG, bioimpedance scale results, and indirect calorimeter results. I used this information to tailor a meal plan for the patient that will help her to lose weight and will improve her obesity-related conditions going forward. I performed a medically necessary appropriate examination and/or evaluation. I discussed the assessment and treatment plan with the patient. The patient was provided an opportunity to ask questions and all were answered. The patient agreed with the plan and demonstrated an understanding of the instructions. Labs were  ordered at this visit and will be reviewed at the next visit unless more critical results need to be addressed immediately. Clinical information was updated and documented in the EMR.   Time spent on visit including pre-visit chart review and post-visit care was 45 minutes.   A separate 15 minutes was spent on risk counseling (see above).  I have reviewed the above documentation for accuracy and completeness, and I agree with the above. - Katherina Mires, MD

## 2020-08-08 ENCOUNTER — Ambulatory Visit (INDEPENDENT_AMBULATORY_CARE_PROVIDER_SITE_OTHER): Payer: 59 | Admitting: Family Medicine

## 2020-08-08 ENCOUNTER — Other Ambulatory Visit: Payer: Self-pay

## 2020-08-08 ENCOUNTER — Encounter (INDEPENDENT_AMBULATORY_CARE_PROVIDER_SITE_OTHER): Payer: Self-pay | Admitting: Family Medicine

## 2020-08-08 VITALS — BP 103/64 | HR 72 | Temp 97.5°F | Ht 60.0 in | Wt 244.0 lb

## 2020-08-08 DIAGNOSIS — E8881 Metabolic syndrome: Secondary | ICD-10-CM | POA: Diagnosis not present

## 2020-08-08 DIAGNOSIS — R0602 Shortness of breath: Secondary | ICD-10-CM

## 2020-08-08 DIAGNOSIS — Z6841 Body Mass Index (BMI) 40.0 and over, adult: Secondary | ICD-10-CM

## 2020-08-08 DIAGNOSIS — F419 Anxiety disorder, unspecified: Secondary | ICD-10-CM

## 2020-08-08 DIAGNOSIS — E782 Mixed hyperlipidemia: Secondary | ICD-10-CM

## 2020-08-08 DIAGNOSIS — E559 Vitamin D deficiency, unspecified: Secondary | ICD-10-CM

## 2020-08-08 DIAGNOSIS — F32A Depression, unspecified: Secondary | ICD-10-CM

## 2020-08-08 DIAGNOSIS — Z9189 Other specified personal risk factors, not elsewhere classified: Secondary | ICD-10-CM

## 2020-08-08 MED ORDER — ALBUTEROL SULFATE HFA 108 (90 BASE) MCG/ACT IN AERS: INHALATION_SPRAY | RESPIRATORY_TRACT | 1 refills | Status: DC

## 2020-08-08 MED ORDER — VITAMIN D (ERGOCALCIFEROL) 1.25 MG (50000 UNIT) PO CAPS: 50000.0000 [IU] | ORAL_CAPSULE | ORAL | 0 refills | Status: DC

## 2020-08-10 NOTE — Progress Notes (Signed)
Chief Complaint:   OBESITY Kristin Blanchard is here to discuss her progress with her obesity treatment plan along with follow-up of her obesity related diagnoses. [name] is on the Category 2 Plan and states she is following her eating plan approximately 50% of the time. [name] states she is doing 0 minutes 0 times per week.  Today's visit was #: 2 Starting weight: 244 lbs Starting date: 07/25/2020 Today's weight: 244 lbs Today's date: 08/08/2020 Total lbs lost to date: 0 Total lbs lost since last in-office visit: 0  Interim History: Pt felt challenged to balance going out with friends and following plan. She ate all 3 meals 2 times in the past 2 weeks. She also went on vacation. When going out with friends she indulged in coffee with parfait, BBQ or chicken sandwiches. [name] and drink. She wants to know calories/nutrition for the next few weeks.  Subjective:   1. SOB (shortness of breath) on exertion Pt uses albuterol inhaler as needed. She has had no change in symptoms currently with medication.  2. Vitamin D deficiency Pt is not on Vit D supplementation. Her Vit D level is 17.0. She reports fatigue.  3. Mixed hyperlipidemia Pt's last lipid panel resulted LDL 122, HDL 71, triglycerides 66. She is not on statin therapy. No risk stratify due to age.  Lab Results  Component Value Date   ALT 18 07/25/2020   AST 19 07/25/2020   ALKPHOS 92 07/25/2020   BILITOT <0.2 07/25/2020   Lab Results  Component Value Date   CHOL 205 (H) 07/25/2020   HDL 71 07/25/2020   LDLCALC 122 (H) 07/25/2020   TRIG 66 07/25/2020   CHOLHDL 2.4 03/09/2016    4. Insulin resistance Pt's last A1c ws 5.5 with an insulin level of 23.1. She has a history of PCOS.  Lab Results  Component Value Date   INSULIN 23.1 07/25/2020   Lab Results  Component Value Date   HGBA1C 5.5 07/25/2020    5. Anxiety and depression Pt took Buspar 1 time but had dizziness and fatigue and had to lay down. Overall she feels more  clear headed.  6. At risk for diabetes mellitus Shannen is at higher than average risk for developing diabetes due to obesity.   Assessment/Plan:   1. SOB (shortness of breath) on exertion Cambree does feel that she gets out of breath more easily that she used to when she exercises. Christella's shortness of breath appears to be obesity related and exercise induced. She has agreed to work on weight loss and gradually increase exercise to treat her exercise induced shortness of breath. Will continue to monitor closely.  - albuterol (VENTOLIN HFA) 108 (90 Base) MCG/ACT inhaler; Inhale 2 puffs into the lungs every 4 hours as needed to treat wheezes, cough, shortness of breath  Dispense: 1 each; Refill: 1  2. Vitamin D deficiency Low Vitamin D level contributes to fatigue and are associated with obesity, breast, and colon cancer. She agrees to continue to take prescription Vitamin D @50 ,000 IU every week and will follow-up for routine testing of Vitamin D, at least 2-3 times per year to avoid over-replacement.  - Vitamin D, Ergocalciferol, (DRISDOL) 1.25 MG (50000 UNIT) CAPS capsule; Take 1 capsule (50,000 Units total) by mouth every 7 (seven) days.  Dispense: 4 capsule; Refill: 0  3. Mixed hyperlipidemia Cardiovascular risk and specific lipid/LDL goals reviewed.  We discussed several lifestyle modifications today and [name] will continue to work on diet, exercise and weight loss  efforts. Orders and follow up as documented in patient record. Follow up in 3 months.  Counseling Intensive lifestyle modifications are the first line treatment for this issue. . Dietary changes: Increase soluble fiber. Decrease simple carbohydrates. . Exercise changes: Moderate to vigorous-intensity aerobic activity 150 minutes per week if tolerated. . Lipid-lowering medications: see documented in medical record.  4. Insulin resistance [name] will continue to work on weight loss, exercise, and decreasing simple carbohydrates to help  decrease the risk of diabetes. [name] agreed to follow-up with [place] as directed to closely monitor her progress. Continue meal plan with journaling option.  5. Anxiety and depression Behavior modification techniques were discussed today to help [name] deal with her anxiety.  Orders and follow up as documented in patient record. Decrease Buspar to 1/2 tab prn.  6. At risk for diabetes mellitus [name] was given approximately 30 minutes of diabetes education and counseling today. We discussed intensive lifestyle modifications today with an emphasis on weight loss as well as increasing exercise and decreasing simple carbohydrates in her diet. We also reviewed medication options with an emphasis on risk versus benefit of those discussed.   Repetitive spaced learning was employed today to elicit superior memory formation and behavioral change.  7. Class 3 severe obesity with serious comorbidity and body mass index (BMI) of 45.0 to 49.9 in adult, unspecified obesity type (HCC) [name] is currently in the action stage of change. As such, her goal is to continue with weight loss efforts. She has agreed to keeping a food journal and adhering to recommended goals of 1300-1400 calories and 85+ g protein.   Exercise goals: No exercise has been prescribed at this time.  Behavioral modification strategies: increasing lean protein intake, meal planning and cooking strategies, planning for success and keeping a strict food journal.  [name] has agreed to follow-up with our clinic in 2 weeks. She was informed of the importance of frequent follow-up visits to maximize her success with intensive lifestyle modifications for her multiple health conditions.   Objective:   Blood pressure 103/64, pulse 72, temperature (!) 97.5 F (36.4 C), temperature source Oral, height 5' (1.524 m), weight 244 lb (110.7 kg), last menstrual period 07/12/2020, SpO2 98 %. Body mass index is 47.65 kg/m.  General: Cooperative, alert, well  developed, in no acute distress. HEENT: Conjunctivae and lids unremarkable. Cardiovascular: Regular rhythm.  Lungs: Normal work of breathing. Neurologic: No focal deficits.   Lab Results  Component Value Date   CREATININE 0.65 07/25/2020   BUN 10 07/25/2020   NA 142 07/25/2020   K 5.0 07/25/2020   CL 105 07/25/2020   CO2 22 07/25/2020   Lab Results  Component Value Date   ALT 18 07/25/2020   AST 19 07/25/2020   ALKPHOS 92 07/25/2020   BILITOT <0.2 07/25/2020   Lab Results  Component Value Date   HGBA1C 5.5 07/25/2020   HGBA1C 5.2 08/10/2016   HGBA1C 5.1 03/09/2016   HGBA1C 5.2 11/15/2014   Lab Results  Component Value Date   INSULIN 23.1 07/25/2020   Lab Results  Component Value Date   TSH 1.510 07/25/2020   Lab Results  Component Value Date   CHOL 205 (H) 07/25/2020   HDL 71 07/25/2020   LDLCALC 122 (H) 07/25/2020   TRIG 66 07/25/2020   CHOLHDL 2.4 03/09/2016   Lab Results  Component Value Date   WBC 4.0 07/25/2020   HGB 12.6 07/25/2020   HCT 38.9 07/25/2020   MCV 90 07/25/2020   PLT  361 07/25/2020    Attestation Statements:   Reviewed by clinician on day of visit: allergies, medications, problem list, medical history, surgical history, family history, social history, and previous encounter notes.  Edmund Hilda, am acting as transcriptionist for Reuben Likes, MD.   I have reviewed the above documentation for accuracy and completeness, and I agree with the above. - Katherina Mires, MD

## 2020-08-29 ENCOUNTER — Encounter (INDEPENDENT_AMBULATORY_CARE_PROVIDER_SITE_OTHER): Payer: Self-pay | Admitting: Family Medicine

## 2020-08-29 ENCOUNTER — Other Ambulatory Visit: Payer: Self-pay

## 2020-08-29 ENCOUNTER — Ambulatory Visit (INDEPENDENT_AMBULATORY_CARE_PROVIDER_SITE_OTHER): Payer: 59 | Admitting: Family Medicine

## 2020-08-29 VITALS — BP 115/84 | HR 75 | Temp 97.5°F | Ht 60.0 in | Wt 238.0 lb

## 2020-08-29 DIAGNOSIS — E559 Vitamin D deficiency, unspecified: Secondary | ICD-10-CM

## 2020-08-29 DIAGNOSIS — Z9189 Other specified personal risk factors, not elsewhere classified: Secondary | ICD-10-CM

## 2020-08-29 DIAGNOSIS — J3089 Other allergic rhinitis: Secondary | ICD-10-CM

## 2020-08-29 DIAGNOSIS — F419 Anxiety disorder, unspecified: Secondary | ICD-10-CM

## 2020-08-29 DIAGNOSIS — Z6841 Body Mass Index (BMI) 40.0 and over, adult: Secondary | ICD-10-CM

## 2020-08-29 DIAGNOSIS — F32A Depression, unspecified: Secondary | ICD-10-CM

## 2020-08-29 DIAGNOSIS — E66813 Obesity, class 3: Secondary | ICD-10-CM

## 2020-08-29 MED ORDER — CETIRIZINE HCL 10 MG PO TABS: 10.0000 mg | ORAL_TABLET | Freq: Every day | ORAL | 0 refills | Status: DC

## 2020-08-29 MED ORDER — ESCITALOPRAM OXALATE 10 MG PO TABS: 10.0000 mg | ORAL_TABLET | Freq: Every day | ORAL | 0 refills | Status: DC

## 2020-08-29 MED ORDER — VITAMIN D (ERGOCALCIFEROL) 1.25 MG (50000 UNIT) PO CAPS: 50000.0000 [IU] | ORAL_CAPSULE | ORAL | 0 refills | Status: DC

## 2020-08-31 NOTE — Progress Notes (Signed)
Chief Complaint:   OBESITY Kristin Blanchard is here to discuss her progress with her obesity treatment plan along with follow-up of her obesity related diagnoses. Kristin Blanchard is on the Category 2 Plan and keeping a food journal and adhering to recommended goals of 1350 calories and 85 protein and states she is following her eating plan approximately 95% of the time. Kristin Blanchard states she is doing Just Dance 30 minutes 1 times per week.  Today's visit was #: 3 Starting weight: 244 lbs Starting date: 07/25/2020 Today's weight: 238 lbs Today's date: 08/29/2020 Total lbs lost to date: 6 lbs Total lbs lost since last in-office visit: 6  Interim History: Kristin Blanchard has had quite a bit of stress with school recently. She is handling stress through procrastination; finishing papers right before they are due and studying 1-2 days prior to exam. She has been logging calories- ranging 60-85 g protein. She had her menstrual cycle and finds herself cravings snacks at that time. Pt is feeling full more frequently. She is not eating meals at traditional times but getting 3 meals in daily.  Subjective:   1. Vitamin D deficiency Pt denies nausea, vomiting, and muscle weakness but notes fatigue. Pt is on prescription Vit D.  2. Anxiety and depression Pt denies suicidal or homicidal ideations. She is on Lexapro 10 mg daily. Her symptoms are better controlled.   3. Non-seasonal allergic rhinitis due to other allergic trigger Pt's symptoms are better controlled with Zyrtec.   4. At risk for deficient intake of food Kristin Blanchard is at risk for deficient intake of food.  Assessment/Plan:   1. Vitamin D deficiency Low Vitamin D level contributes to fatigue and are associated with obesity, breast, and colon cancer. She agrees to continue to take prescription Vitamin D @50 ,000 IU every week and will follow-up for routine testing of Vitamin D, at least 2-3 times per year to avoid over-replacement.  - Vitamin D, Ergocalciferol, (DRISDOL) 1.25  MG (50000 UNIT) CAPS capsule; Take 1 capsule (50,000 Units total) by mouth every 7 (seven) days.  Dispense: 4 capsule; Refill: 0  2. Anxiety and depression Behavior modification techniques were discussed today to help Kristin Blanchard deal with her anxiety.  Orders and follow up as documented in patient record.   - escitalopram (LEXAPRO) 10 MG tablet; Take 1 tablet (10 mg total) by mouth daily.  Dispense: 30 tablet; Refill: 0  3. Non-seasonal allergic rhinitis due to other allergic trigger Refill Zyrtec 10 mg, as per below.  - cetirizine (ZYRTEC) 10 MG tablet; Take 1 tablet (10 mg total) by mouth daily.  Dispense: 30 tablet; Refill: 0  4. At risk for deficient intake of food Kristin Blanchard was given approximately 15 minutes of deficit intake of food prevention counseling today. Elbert is at risk for eating too few calories based on current food recall. She was encouraged to focus on meeting caloric and protein goals according to her recommended meal plan.   5. Class 3 severe obesity with serious comorbidity and body mass index (BMI) of 45.0 to 49.9 in adult, unspecified obesity type (HCC) Kristin Blanchard is currently in the action stage of change. As such, her goal is to continue with weight loss efforts. She has agreed to keeping a food journal and adhering to recommended goals of 1350 calories and 85+ g protein.   Exercise goals: As is  Behavioral modification strategies: increasing lean protein intake, no skipping meals, planning for success and keeping a strict food journal.  Kristin Blanchard has agreed to follow-up with  our clinic in 2-3 weeks. She was informed of the importance of frequent follow-up visits to maximize her success with intensive lifestyle modifications for her multiple health conditions.   Objective:   Blood pressure 115/84, pulse 75, temperature (!) 97.5 F (36.4 C), temperature source Oral, height 5' (1.524 m), weight 238 lb (108 kg), last menstrual period 08/21/2020, SpO2 98 %. Body mass index is 46.48  kg/m.  General: Cooperative, alert, well developed, in no acute distress. HEENT: Conjunctivae and lids unremarkable. Cardiovascular: Regular rhythm.  Lungs: Normal work of breathing. Neurologic: No focal deficits.   Lab Results  Component Value Date   CREATININE 0.65 07/25/2020   BUN 10 07/25/2020   NA 142 07/25/2020   K 5.0 07/25/2020   CL 105 07/25/2020   CO2 22 07/25/2020   Lab Results  Component Value Date   ALT 18 07/25/2020   AST 19 07/25/2020   ALKPHOS 92 07/25/2020   BILITOT <0.2 07/25/2020   Lab Results  Component Value Date   HGBA1C 5.5 07/25/2020   HGBA1C 5.2 08/10/2016   HGBA1C 5.1 03/09/2016   HGBA1C 5.2 11/15/2014   Lab Results  Component Value Date   INSULIN 23.1 07/25/2020   Lab Results  Component Value Date   TSH 1.510 07/25/2020   Lab Results  Component Value Date   CHOL 205 (H) 07/25/2020   HDL 71 07/25/2020   LDLCALC 122 (H) 07/25/2020   TRIG 66 07/25/2020   CHOLHDL 2.4 03/09/2016   Lab Results  Component Value Date   WBC 4.0 07/25/2020   HGB 12.6 07/25/2020   HCT 38.9 07/25/2020   MCV 90 07/25/2020   PLT 361 07/25/2020    Attestation Statements:   Reviewed by clinician on day of visit: allergies, medications, problem list, medical history, surgical history, family history, social history, and previous encounter notes.  Edmund Hilda, am acting as transcriptionist for Reuben Likes, MD.  I have reviewed the above documentation for accuracy and completeness, and I agree with the above. - Katherina Mires, MD

## 2020-09-19 ENCOUNTER — Other Ambulatory Visit: Payer: Self-pay

## 2020-09-19 ENCOUNTER — Encounter (INDEPENDENT_AMBULATORY_CARE_PROVIDER_SITE_OTHER): Payer: Self-pay | Admitting: Family Medicine

## 2020-09-19 ENCOUNTER — Ambulatory Visit (INDEPENDENT_AMBULATORY_CARE_PROVIDER_SITE_OTHER): Payer: 59 | Admitting: Family Medicine

## 2020-09-19 VITALS — BP 111/77 | HR 73 | Temp 97.7°F | Ht 60.0 in | Wt 241.0 lb

## 2020-09-19 DIAGNOSIS — E559 Vitamin D deficiency, unspecified: Secondary | ICD-10-CM | POA: Diagnosis not present

## 2020-09-19 DIAGNOSIS — Z6841 Body Mass Index (BMI) 40.0 and over, adult: Secondary | ICD-10-CM

## 2020-09-19 DIAGNOSIS — F32A Depression, unspecified: Secondary | ICD-10-CM

## 2020-09-19 DIAGNOSIS — F419 Anxiety disorder, unspecified: Secondary | ICD-10-CM

## 2020-09-19 DIAGNOSIS — Z9189 Other specified personal risk factors, not elsewhere classified: Secondary | ICD-10-CM

## 2020-09-19 MED ORDER — BUSPIRONE HCL 7.5 MG PO TABS: 7.5000 mg | ORAL_TABLET | Freq: Three times a day (TID) | ORAL | 0 refills | Status: DC | PRN

## 2020-09-19 MED ORDER — ESCITALOPRAM OXALATE 10 MG PO TABS: 10.0000 mg | ORAL_TABLET | Freq: Every day | ORAL | 0 refills | Status: DC

## 2020-09-20 NOTE — Progress Notes (Signed)
Chief Complaint:   OBESITY Kristin Blanchard is here to discuss her progress with her obesity treatment plan along with follow-up of her obesity related diagnoses. Kristin Blanchard is on keeping a food journal and adhering to recommended goals of 1350 calories and 85 g protein and states she is following her eating plan approximately 50% of the time. Kristin Blanchard states she is not currently exercising.  Today's visit was #: 4 Starting weight: 244 lbs Starting date: 07/25/2020 Today's weight: 241 lbs Today's date: 09/19/2020 Total lbs lost to date: 3 Total lbs lost since last in-office visit: 0  Interim History: December is currently fating for Ramadan. She recognizes that she was doing really well on plan the first 2 weeks but struggled the 3rd week with school stress and breaking fasts with people. She doesn't have a big appetite but has a large sweets craving. She didn't care for quest protein bars. She is sticking with breakfast and has decreased sweetened coffee beverages. She was eating at other people's house everyday last week. She recognizes also that her snacking had increased recently as well.  Subjective:   1. Anxiety and depression Kristin Blanchard denies suicidal or homicidal ideations. She is on Lexapro and Buspar. Pt reports increased stress with school.  2. Vitamin D deficiency Kristin Blanchard denies nausea, vomiting, and muscle weakness but notes fatigue. Pt is on prescription Vit D.  3. At risk for deficient intake of food Kristin Blanchard is at risk for deficient intake of food due to fasting for Ramadan.  Assessment/Plan:   1. Anxiety and depression Behavior modification techniques were discussed today to help Kristin Blanchard deal with her anxiety.  Orders and follow up as documented in patient record. Continue current treatment plan.  - busPIRone (BUSPAR) 7.5 MG tablet; Take 1 tablet (7.5 mg total) by mouth 3 (three) times daily as needed (anxiety).  Dispense: 30 tablet; Refill: 0 - escitalopram (LEXAPRO) 10 MG tablet; Take 1 tablet (10 mg  total) by mouth daily.  Dispense: 30 tablet; Refill: 0  2. Vitamin D deficiency Low Vitamin D level contributes to fatigue and are associated with obesity, breast, and colon cancer. She agrees to continue to take prescription Vitamin D @50 ,000 IU every week and will follow-up for routine testing of Vitamin D, at least 2-3 times per year to avoid over-replacement.  3. At risk for deficient intake of food Kristin Blanchard was given approximately 15 minutes of deficit intake of food prevention counseling today. Kristin Blanchard is at risk for eating too few calories based on current food recall. She was encouraged to focus on meeting caloric and protein goals according to her recommended meal plan.   4. Class 3 severe obesity with serious comorbidity and body mass index (BMI) of 45.0 to 49.9 in adult, unspecified obesity type (HCC) Kristin Blanchard is currently in the action stage of change. As such, her goal is to continue with weight loss efforts. She has agreed to keeping a food journal and adhering to recommended goals of 1350 calories and 85+ g protein.   Exercise goals: No exercise has been prescribed at this time.  Behavioral modification strategies: increasing lean protein intake, meal planning and cooking strategies, celebration eating strategies and avoiding temptations.  Glee has agreed to follow-up with our clinic in 2 weeks. She was informed of the importance of frequent follow-up visits to maximize her success with intensive lifestyle modifications for her multiple health conditions.   Objective:   Blood pressure 111/77, pulse 73, temperature 97.7 F (36.5 C), height 5' (1.524 m), weight  241 lb (109.3 kg), last menstrual period 08/21/2020, SpO2 99 %. Body mass index is 47.07 kg/m.  General: Cooperative, alert, well developed, in no acute distress. HEENT: Conjunctivae and lids unremarkable. Cardiovascular: Regular rhythm.  Lungs: Normal work of breathing. Neurologic: No focal deficits.   Lab Results  Component  Value Date   CREATININE 0.65 07/25/2020   BUN 10 07/25/2020   NA 142 07/25/2020   K 5.0 07/25/2020   CL 105 07/25/2020   CO2 22 07/25/2020   Lab Results  Component Value Date   ALT 18 07/25/2020   AST 19 07/25/2020   ALKPHOS 92 07/25/2020   BILITOT <0.2 07/25/2020   Lab Results  Component Value Date   HGBA1C 5.5 07/25/2020   HGBA1C 5.2 08/10/2016   HGBA1C 5.1 03/09/2016   HGBA1C 5.2 11/15/2014   Lab Results  Component Value Date   INSULIN 23.1 07/25/2020   Lab Results  Component Value Date   TSH 1.510 07/25/2020   Lab Results  Component Value Date   CHOL 205 (H) 07/25/2020   HDL 71 07/25/2020   LDLCALC 122 (H) 07/25/2020   TRIG 66 07/25/2020   CHOLHDL 2.4 03/09/2016   Lab Results  Component Value Date   WBC 4.0 07/25/2020   HGB 12.6 07/25/2020   HCT 38.9 07/25/2020   MCV 90 07/25/2020   PLT 361 07/25/2020    Attestation Statements:   Reviewed by clinician on day of visit: allergies, medications, problem list, medical history, surgical history, family history, social history, and previous encounter notes.  Edmund Hilda, am acting as transcriptionist for Reuben Likes, MD.   I have reviewed the above documentation for accuracy and completeness, and I agree with the above. - Katherina Mires, MD

## 2020-10-04 ENCOUNTER — Ambulatory Visit (INDEPENDENT_AMBULATORY_CARE_PROVIDER_SITE_OTHER): Payer: 59 | Admitting: Family Medicine

## 2020-10-04 ENCOUNTER — Encounter (INDEPENDENT_AMBULATORY_CARE_PROVIDER_SITE_OTHER): Payer: Self-pay | Admitting: Family Medicine

## 2020-10-04 ENCOUNTER — Other Ambulatory Visit: Payer: Self-pay

## 2020-10-04 VITALS — BP 107/65 | HR 73 | Temp 97.7°F | Ht 60.0 in | Wt 238.0 lb

## 2020-10-04 DIAGNOSIS — Z9189 Other specified personal risk factors, not elsewhere classified: Secondary | ICD-10-CM | POA: Diagnosis not present

## 2020-10-04 DIAGNOSIS — F32A Depression, unspecified: Secondary | ICD-10-CM

## 2020-10-04 DIAGNOSIS — J3089 Other allergic rhinitis: Secondary | ICD-10-CM | POA: Diagnosis not present

## 2020-10-04 DIAGNOSIS — Z6841 Body Mass Index (BMI) 40.0 and over, adult: Secondary | ICD-10-CM

## 2020-10-04 DIAGNOSIS — E559 Vitamin D deficiency, unspecified: Secondary | ICD-10-CM | POA: Diagnosis not present

## 2020-10-04 DIAGNOSIS — F419 Anxiety disorder, unspecified: Secondary | ICD-10-CM

## 2020-10-04 MED ORDER — VITAMIN D (ERGOCALCIFEROL) 1.25 MG (50000 UNIT) PO CAPS
50000.0000 [IU] | ORAL_CAPSULE | ORAL | 0 refills | Status: DC
Start: 2020-10-04 — End: 2020-10-19

## 2020-10-04 MED ORDER — CETIRIZINE HCL 10 MG PO TABS: 10.0000 mg | ORAL_TABLET | Freq: Every day | ORAL | 0 refills | Status: DC

## 2020-10-04 MED ORDER — ESCITALOPRAM OXALATE 10 MG PO TABS: 10.0000 mg | ORAL_TABLET | Freq: Every day | ORAL | 0 refills | Status: DC

## 2020-10-05 ENCOUNTER — Ambulatory Visit (HOSPITAL_COMMUNITY)
Admission: EM | Admit: 2020-10-05 | Discharge: 2020-10-05 | Disposition: A | Discharge status: Home / Self Care | Payer: 59 | Attending: Nurse Practitioner | Admitting: Nurse Practitioner

## 2020-10-05 DIAGNOSIS — F331 Major depressive disorder, recurrent, moderate: Secondary | ICD-10-CM

## 2020-10-05 DIAGNOSIS — F411 Generalized anxiety disorder: Secondary | ICD-10-CM

## 2020-10-05 MED ORDER — LORAZEPAM 1 MG PO TABS
1.0000 mg | ORAL_TABLET | Freq: Once | ORAL | Status: AC
  Administered 2020-10-05: 1 mg via ORAL
  Filled 2020-10-05: qty 1

## 2020-10-05 MED ORDER — HYDROXYZINE PAMOATE 25 MG PO CAPS: 25.0000 mg | ORAL_CAPSULE | Freq: Three times a day (TID) | ORAL | 0 refills | Status: DC | PRN

## 2020-10-05 NOTE — Progress Notes (Signed)
Chief Complaint:   OBESITY Kristin Blanchard is here to discuss her progress with her obesity treatment plan along with follow-up of her obesity related diagnoses. [name] is on keeping a food journal and adhering to recommended goals of 1350 calories and 85 protein and states she is following her eating plan approximately 40% of the time. Kristin Blanchard states she is not currently exercising.  Today's visit was #: 5 Starting weight: 244 lbs Starting date: 07/25/2020 Today's weight: 238 lbs Today's date: 10/04/2020 Total lbs lost to date: 6 Total lbs lost since last in-office visit: 3  Interim History: Pt voices she finished Ramadan and turned in her last final today. She doesn't think she's been doing well food wise. She notices that days she struggles with meal plan are days she doesn't journal. One class this summer is body conditioning.  Subjective:   1. Non-seasonal allergic rhinitis due to other allergic trigger Pt is doing well on Zyrtec. Her symptoms are relatively well controlled.  2. Vitamin D deficiency Pt denies nausea, vomiting, and muscle weakness but notes fatigue. Pt is on prescription Vit D.  3. Anxiety and depression Symptoms are better controlled on Lexapro and Buspar. Pt denies suicidal or homicidal ideations.  4. At risk for osteoporosis Kristin Blanchard is at higher risk of osteopenia and osteoporosis due to Vitamin D deficiency.   Assessment/Plan:   1. Non-seasonal allergic rhinitis due to other allergic trigger Continue Zyrtec as directed.  - cetirizine (ZYRTEC) 10 MG tablet; Take 1 tablet (10 mg total) by mouth daily.  Dispense: 30 tablet; Refill: 0  2. Vitamin D deficiency Low Vitamin D level contributes to fatigue and are associated with obesity, breast, and colon cancer. She agrees to continue to take prescription Vitamin D @50 ,000 IU every week and will follow-up for routine testing of Vitamin D, at least 2-3 times per year to avoid over-replacement.  - Vitamin D, Ergocalciferol,  (DRISDOL) 1.25 MG (50000 UNIT) CAPS capsule; Take 1 capsule (50,000 Units total) by mouth every 7 (seven) days.  Dispense: 4 capsule; Refill: 0  3. Anxiety and depression Behavior modification techniques were discussed today to help Kristin Blanchard deal with her anxiety.  Orders and follow up as documented in patient record.  - escitalopram (LEXAPRO) 10 MG tablet; Take 1 tablet (10 mg total) by mouth daily.  Dispense: 30 tablet; Refill: 0  4. At risk for osteoporosis Kristin Blanchard was given approximately 15 minutes of osteoporosis prevention counseling today. Kristin Blanchard is at risk for osteopenia and osteoporosis due to her Vitamin D deficiency. She was encouraged to take her Vitamin D and follow her higher calcium diet and increase strengthening exercise to help strengthen her bones and decrease her risk of osteopenia and osteoporosis.  Repetitive spaced learning was employed today to elicit superior memory formation and behavioral change.  5. Class 3 severe obesity with serious comorbidity and body mass index (BMI) of 45.0 to 49.9 in adult, unspecified obesity type (HCC)  Kristin Blanchard is currently in the action stage of change. As such, her goal is to continue with weight loss efforts. She has agreed to keeping a food journal and adhering to recommended goals of 1350 calories and 85+ g protein.   Exercise goals: As is  Behavioral modification strategies: increasing lean protein intake, meal planning and cooking strategies, planning for success and keeping a strict food journal.  Kristin Blanchard has agreed to follow-up with our clinic in 2-3 weeks. She was informed of the importance of frequent follow-up visits to maximize her success with  intensive lifestyle modifications for her multiple health conditions.   Objective:   Blood pressure 107/65, pulse 73, temperature 97.7 F (36.5 C), height 5' (1.524 m), weight 238 lb (108 kg), SpO2 99 %. Body mass index is 46.48 kg/m.  General: Cooperative, alert, well developed, in no acute  distress. HEENT: Conjunctivae and lids unremarkable. Cardiovascular: Regular rhythm.  Lungs: Normal work of breathing. Neurologic: No focal deficits.   Lab Results  Component Value Date   CREATININE 0.65 07/25/2020   BUN 10 07/25/2020   NA 142 07/25/2020   K 5.0 07/25/2020   CL 105 07/25/2020   CO2 22 07/25/2020   Lab Results  Component Value Date   ALT 18 07/25/2020   AST 19 07/25/2020   ALKPHOS 92 07/25/2020   BILITOT <0.2 07/25/2020   Lab Results  Component Value Date   HGBA1C 5.5 07/25/2020   HGBA1C 5.2 08/10/2016   HGBA1C 5.1 03/09/2016   HGBA1C 5.2 11/15/2014   Lab Results  Component Value Date   INSULIN 23.1 07/25/2020   Lab Results  Component Value Date   TSH 1.510 07/25/2020   Lab Results  Component Value Date   CHOL 205 (H) 07/25/2020   HDL 71 07/25/2020   LDLCALC 122 (H) 07/25/2020   TRIG 66 07/25/2020   CHOLHDL 2.4 03/09/2016   Lab Results  Component Value Date   WBC 4.0 07/25/2020   HGB 12.6 07/25/2020   HCT 38.9 07/25/2020   MCV 90 07/25/2020   PLT 361 07/25/2020    Attestation Statements:   Reviewed by clinician on day of visit: allergies, medications, problem list, medical history, surgical history, family history, social history, and previous encounter notes.  Edmund Hilda, CMA, am acting as transcriptionist for Reuben Likes, MD.   I have reviewed the above documentation for accuracy and completeness, and I agree with the above. - Katherina Mires, MD

## 2020-10-05 NOTE — ED Provider Notes (Signed)
[facility] Medical Screening Exam  Patient Name: Kristin Blanchard MRN: [id number] Date of Evaluation: 10/05/20 Chief Complaint:   Diagnosis:  Final diagnoses:  Generalized anxiety disorder  MDD (major depressive disorder), recurrent episode, moderate (HCC)    History of Present illness: Kristin Blanchard is a 25 y.o. female with a history of depression and anxiety who presents to [facility] voluntarily due to anxiety. Patient reports that she is feeling anxious due to finding out that a cousin will be moving in with her parents. She states that she is also not doing well in school and will not be able to graduate until December instead of this May. She states that she has an argument with her parents tonight and this worsened her anxiety. Patient also expresses concern that she may one day experience mania due to her brother having a previous episode of mania. Patient denies any clear history of mania. She endorses excessive worry and racing thoughts. Patient denies symptoms of mania or hypomania, including euphoric mood, sleeplessness, increased activity, impulsive/reckless behavior, increased talkativeness, marked inability to focus and excessive self-confidence. Patient denies suicidal thoughts. She denies homicidal thoughts. She denies auditory and visual hallucinations. No indication that she is responding to internal stimuli. Denies paranoia. Denies substance abuse. Patient reports that she feels safe returning home.   Psychiatric Specialty Exam  Presentation  General Appearance:Appropriate for Environment; Well Groomed  Eye Contact:Fair  Speech:Clear and Coherent; Normal Rate  Speech Volume:Decreased  Handedness:No data recorded  Mood and Affect  Mood:Anxious; Depressed  Affect:Depressed; Tearful   Thought Process  Thought Processes:Coherent; Linear  Descriptions of Associations:Intact  Orientation:Full (Time, Place and Person)  Thought Content:Logical     Hallucinations:None  Ideas of Reference:None  Suicidal Thoughts:No  Homicidal Thoughts:No   Sensorium  Memory:Immediate Good; Recent Good; Remote Good  Judgment:Intact  Insight:Fair   Executive Functions  Concentration:Fair  Attention Span:Fair  Recall:Good  Fund of Knowledge:Good  Language:Good   Psychomotor Activity  Psychomotor Activity:Normal   Assets  Assets:Communication Skills; Desire for Improvement; Housing; Health and safety inspector; Physical Health; Social Support   Sleep  Sleep:Fair  Number of hours: No data recorded  Nutritional Assessment (For OBS and [facility] admissions only) Has the patient had a weight loss or gain of 10 pounds or more in the last 3 months?: No Has the patient had a decrease in food intake/or appetite?: No Does the patient have dental problems?: No Does the patient have eating habits or behaviors that may be indicators of an eating disorder including binging or inducing vomiting?: No Has the patient recently lost weight without trying?: No Has the patient been eating poorly because of a decreased appetite?: No Malnutrition Screening Tool Score: 0    Physical Exam: Physical Exam Constitutional:      General: She is not in acute distress.    Appearance: She is not ill-appearing, toxic-appearing or diaphoretic.  HENT:     Head: Normocephalic.     Right Ear: External ear normal.     Left Ear: External ear normal.  Eyes:     Conjunctiva/sclera: Conjunctivae normal.     Pupils: Pupils are equal, round, and reactive to light.  Cardiovascular:     Rate and Rhythm: Normal rate.  Pulmonary:     Effort: Pulmonary effort is normal. No respiratory distress.  Musculoskeletal:        General: Normal range of motion.  Skin:    General: Skin is warm and dry.  Neurological:     Mental Status: She is  alert and oriented to person, place, and time.  Psychiatric:        Mood and Affect: Mood is anxious and depressed.         Thought Content: Thought content is not paranoid or delusional. Thought content does not include homicidal or suicidal ideation.    Review of Systems  Constitutional: Negative for chills, diaphoresis, fever, malaise/fatigue and weight loss.  HENT: Negative for congestion.   Respiratory: Negative for cough and shortness of breath.   Cardiovascular: Negative for chest pain and palpitations.  Gastrointestinal: Negative for diarrhea, nausea and vomiting.  Neurological: Negative for dizziness.  Psychiatric/Behavioral: Positive for depression. Negative for hallucinations, memory loss, substance abuse and suicidal ideas. The patient is nervous/anxious. The patient does not have insomnia.   All other systems reviewed and are negative.  Blood pressure 111/82, pulse 88, temperature 98.4 F (36.9 C), temperature source Oral, resp. rate 18, SpO2 99 %. There is no height or weight on file to calculate BMI.  Musculoskeletal: Strength & Muscle Tone: within normal limits Gait & Station: normal Patient leans: N/A   Suicide Risk:  Minimal: No identifiable suicidal ideation.  Patients presenting with no risk factors but with morbid ruminations; may be classified as minimal risk based on the severity of the depressive symptoms   [facility] MSE Discharge Disposition for Follow up and Recommendations: Based on my evaluation the patient does not appear to have an emergency medical condition and can be discharged with resources and follow up care in outpatient services for Medication Management and Individual Therapy   Patient given Ativan 1 mg x 1 dose for anxiety/panic.   Patient's parents feel safe with the patient returning home. They are present at [facility] and will provide transportation home.   Discussed risks/side effects and benefits of hydroxyzine prn for anxiety. Patient is concerned that she may become addicted. Discussed hydroxyzine's low risk profile for addiction.   Provide prescription for  hydroxyzine 25 mg TID prn for anxiety  Provided with outpatient resources for medication management and therapy.   Jackelyn Poling, NP 10/05/2020, 1:01 AM

## 2020-10-05 NOTE — Progress Notes (Signed)
Patient presents to the [facility] with the police after a 911 call.  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  Patient is overwhelmed tonight.  She has final exams coming up and has to make an A on the exam or she will fail the class, she lacks one class she needs to graduate and will not be graduating in May. Her cousin is coming to live at her paren's home and she states that there is not enough room in the house and she is going to have to entertain her and she states that her cousin is spoiled. Patient states that she found herself arguing with her parents today and she is normally very close to them and feels bad for the way she acted towards them.  Patient denies SI/HI/Psychosis.  She states that she did not sleep well last night, but states that her appetite is fine.  No history of abuse or self-mutilation.  TTS spoke to patient's parents who presented to the [facility].  They have no safety concerns for her and their purpose of having her brought to the [facility] was to get some medication to help calm her down.  They would like to take her home after she is seen.

## 2020-10-05 NOTE — Discharge Instructions (Addendum)
  Discharge recommendations:  Patient is to take medications as prescribed. Please see information for follow-up appointment with psychiatry and therapy. Please follow up with your primary care provider for all medical related needs.   Therapy: We recommend that patient participate in individual therapy to address mental health concerns.  Medications: The patient is to contact a medical professional and/or outpatient provider to address any new side effects that develop. Parent/guardian should update outpatient providers of any new medications and/or medication changes.    Safety:  The patient should abstain from use of illicit substances/drugs and abuse of any medications. If symptoms worsen or do not continue to improve or if the patient becomes actively suicidal or homicidal then it is recommended that the patient return to the closest hospital emergency department, the [facility], or call 911 for further evaluation and treatment. National Suicide Prevention Lifeline 1-800-SUICIDE or 2244576637.

## 2020-10-19 ENCOUNTER — Encounter (INDEPENDENT_AMBULATORY_CARE_PROVIDER_SITE_OTHER): Payer: Self-pay | Admitting: Family Medicine

## 2020-10-19 ENCOUNTER — Other Ambulatory Visit: Payer: Self-pay

## 2020-10-19 ENCOUNTER — Ambulatory Visit (INDEPENDENT_AMBULATORY_CARE_PROVIDER_SITE_OTHER): Payer: 59 | Admitting: Family Medicine

## 2020-10-19 VITALS — BP 104/70 | HR 67 | Temp 97.8°F | Ht 60.0 in | Wt 237.0 lb

## 2020-10-19 DIAGNOSIS — E559 Vitamin D deficiency, unspecified: Secondary | ICD-10-CM | POA: Diagnosis not present

## 2020-10-19 DIAGNOSIS — F32A Depression, unspecified: Secondary | ICD-10-CM

## 2020-10-19 DIAGNOSIS — F419 Anxiety disorder, unspecified: Secondary | ICD-10-CM

## 2020-10-19 DIAGNOSIS — Z9189 Other specified personal risk factors, not elsewhere classified: Secondary | ICD-10-CM | POA: Diagnosis not present

## 2020-10-19 DIAGNOSIS — Z6841 Body Mass Index (BMI) 40.0 and over, adult: Secondary | ICD-10-CM

## 2020-10-19 MED ORDER — BUSPIRONE HCL 7.5 MG PO TABS: 7.5000 mg | ORAL_TABLET | Freq: Three times a day (TID) | ORAL | 0 refills | Status: DC | PRN

## 2020-10-19 MED ORDER — ESCITALOPRAM OXALATE 10 MG PO TABS: 10.0000 mg | ORAL_TABLET | Freq: Every day | ORAL | 0 refills | Status: DC

## 2020-10-19 MED ORDER — VITAMIN D (ERGOCALCIFEROL) 1.25 MG (50000 UNIT) PO CAPS
50000.0000 [IU] | ORAL_CAPSULE | ORAL | 0 refills | Status: DC
Start: 2020-10-19 — End: 2021-01-10

## 2020-10-20 NOTE — Progress Notes (Signed)
Chief Complaint:   OBESITY Kristin Blanchard is here to discuss her progress with her obesity treatment plan along with follow-up of her obesity related diagnoses. Kristin Blanchard is on keeping a food journal and adhering to recommended goals of 1350 calories and 85 g protein and states she is following her eating plan approximately 50% of the time. Kristin Blanchard states she is not currently exercising.  Today's visit was #: 6 Starting weight: 244 lbs Starting date: 07/25/2020 Today's weight: 237 lbs Today's date: 10/19/2020 Total lbs lost to date: 7 Total lbs lost since last in-office visit: 1  Interim History: Kristin Blanchard ultimately had a difficult time for weeks as she was brought to [facility]anch[facility] for significant panic attack. She has started her gym class and is likely moving to [place] after her next appt. She has been taking hydroxyzine frequently. She tends to feel like if she eats indulgently, she needs to make up for it on the next day, so she is often under eating.  Subjective:   1. Vitamin D deficiency Pt denies nausea, vomiting, and muscle weakness but notes fatigue. Pt is on prescription Vit D.  2. Anxiety and depression Lachandra is on Lexapro and Buspar. Pt denies suicidal or homicidal ideations.  3. At risk for deficient intake of food Clodagh is at risk for deficient intake of food due to skipping meals.  Assessment/Plan:   1. Vitamin D deficiency Low Vitamin D level contributes to fatigue and are associated with obesity, breast, and colon cancer. She agrees to continue to take prescription Vitamin D @50 ,000 IU every week and will follow-up for routine testing of Vitamin D, at least 2-3 times per year to avoid over-replacement. - Vitamin D, Ergocalciferol, (DRISDOL) 1.25 MG (50000 UNIT) CAPS capsule; Take 1 capsule (50,000 Units total) by mouth every 7 (seven) days.  Dispense: 12 capsule; Refill: 0  2. Anxiety and depression Behavior modification techniques were discussed today to help Kristin Blanchard  deal with her anxiety.  Orders and follow up as documented in patient record. Behavior modification techniques were discussed today to help Kristin Blanchard deal with her emotional/non-hunger eating behaviors.  Orders and follow up as documented in patient record.   - escitalopram (LEXAPRO) 10 MG tablet; Take 1 tablet (10 mg total) by mouth daily.  Dispense: 90 tablet; Refill: 0 - busPIRone (BUSPAR) 7.5 MG tablet; Take 1 tablet (7.5 mg total) by mouth 3 (three) times daily as needed (anxiety).  Dispense: 90 tablet; Refill: 0  3. At risk for deficient intake of food Kristin Blanchard was given approximately 15 minutes of deficit intake of food prevention counseling today. Kristin Blanchard is at risk for eating too few calories based on current food recall. She was encouraged to focus on meeting caloric and protein goals according to her recommended meal plan.   4. Class 3 severe obesity with serious comorbidity and body mass index (BMI) of 45.0 to 49.9 in adult, unspecified obesity type (HCC)  Kristin Blanchard is currently in the action stage of change. As such, her goal is to continue with weight loss efforts. She has agreed to keeping a food journal and adhering to recommended goals of 1350 calories and 85+ g protein.   Exercise goals: No exercise has been prescribed at this time.  Behavioral modification strategies: increasing lean protein intake, no skipping meals, meal planning and cooking strategies, keeping healthy foods in the home and planning for success.  [name] has agreed to follow-up with our clinic in 2-3 weeks. She was informed of the importance  of frequent follow-up visits to maximize her success with intensive lifestyle modifications for her multiple health conditions.   Objective:   Blood pressure 104/70, pulse 67, temperature 97.8 F (36.6 C), height 5' (1.524 m), weight 237 lb (107.5 kg), SpO2 96 %. Body mass index is 46.29 kg/m.  General: Cooperative, alert, well developed, in no acute distress. HEENT: Conjunctivae and  lids unremarkable. Cardiovascular: Regular rhythm.  Lungs: Normal work of breathing. Neurologic: No focal deficits.   Lab Results  Component Value Date   CREATININE 0.65 07/25/2020   BUN 10 07/25/2020   NA 142 07/25/2020   K 5.0 07/25/2020   CL 105 07/25/2020   CO2 22 07/25/2020   Lab Results  Component Value Date   ALT 18 07/25/2020   AST 19 07/25/2020   ALKPHOS 92 07/25/2020   BILITOT <0.2 07/25/2020   Lab Results  Component Value Date   HGBA1C 5.5 07/25/2020   HGBA1C 5.2 08/10/2016   HGBA1C 5.1 03/09/2016   HGBA1C 5.2 11/15/2014   Lab Results  Component Value Date   INSULIN 23.1 07/25/2020   Lab Results  Component Value Date   TSH 1.510 07/25/2020   Lab Results  Component Value Date   CHOL 205 (H) 07/25/2020   HDL 71 07/25/2020   LDLCALC 122 (H) 07/25/2020   TRIG 66 07/25/2020   CHOLHDL 2.4 03/09/2016   Lab Results  Component Value Date   WBC 4.0 07/25/2020   HGB 12.6 07/25/2020   HCT 38.9 07/25/2020   MCV 90 07/25/2020   PLT 361 07/25/2020   No results found for: IRON, TIBC, FERRITIN  Attestation Statements:   Reviewed by clinician on day of visit: allergies, medications, problem list, medical history, surgical history, family history, social history, and previous encounter notes.  Edmund Hilda, CMA, am acting as transcriptionist for Reuben Likes, MD.   I have reviewed the above documentation for accuracy and completeness, and I agree with the above. - Katherina Mires, MD

## 2020-11-01 ENCOUNTER — Other Ambulatory Visit: Payer: Self-pay

## 2020-11-01 ENCOUNTER — Encounter (INDEPENDENT_AMBULATORY_CARE_PROVIDER_SITE_OTHER): Payer: Self-pay | Admitting: Family Medicine

## 2020-11-01 ENCOUNTER — Ambulatory Visit (INDEPENDENT_AMBULATORY_CARE_PROVIDER_SITE_OTHER): Payer: 59 | Admitting: Family Medicine

## 2020-11-01 VITALS — BP 110/73 | HR 86 | Temp 97.9°F | Ht 60.0 in | Wt 243.0 lb

## 2020-11-01 DIAGNOSIS — F32A Depression, unspecified: Secondary | ICD-10-CM

## 2020-11-01 DIAGNOSIS — Z6841 Body Mass Index (BMI) 40.0 and over, adult: Secondary | ICD-10-CM

## 2020-11-01 DIAGNOSIS — F419 Anxiety disorder, unspecified: Secondary | ICD-10-CM | POA: Diagnosis not present

## 2020-11-01 DIAGNOSIS — E559 Vitamin D deficiency, unspecified: Secondary | ICD-10-CM | POA: Diagnosis not present

## 2020-11-01 DIAGNOSIS — Z9189 Other specified personal risk factors, not elsewhere classified: Secondary | ICD-10-CM | POA: Diagnosis not present

## 2020-11-01 MED ORDER — ESCITALOPRAM OXALATE 20 MG PO TABS
20.0000 mg | ORAL_TABLET | Freq: Every day | ORAL | 0 refills | Status: DC
Start: 2020-11-01 — End: 2021-01-10

## 2020-11-03 NOTE — Progress Notes (Signed)
Chief Complaint:   OBESITY Kristin Blanchard is here to discuss her progress with her obesity treatment plan along with follow-up of her obesity related diagnoses. Kristin Blanchard is on keeping a food journal and adhering to recommended goals of 1350 calories and 85 g protein and states Kristin Blanchard is following her eating plan approximately 20% of the time. Kristin Blanchard states Kristin Blanchard is walked 40 minutes yesterday only.  Today's visit was #: 7 Starting weight: 244 lbs Starting date: 07/25/2020 Today's weight: 243 lbs Today's date: 11/01/2020 Total lbs lost to date: 1 Total lbs lost since last in-office visit: 0  Interim History: Kristin Blanchard voices Kristin Blanchard doesn't feel Kristin Blanchard has done so well on meal plan. Kristin Blanchard is leaving in 4 days for [place] for the summer. Pt wants to be mindful over the summer, while Kristin Blanchard is away. Kristin Blanchard wants to give herself grace to stay within calories and get protein. Kristin Blanchard thinks Kristin Blanchard did her best when Kristin Blanchard was living in her own apartment. Kristin Blanchard hasn't been logging at all due to feeling logging to be a chore.  Subjective:   1. Anxiety and depression Kristin Blanchard is on Lexapro, Buspar, and Vistaril. Kristin Blanchard reports some underlying anxiety that is not being well managed and having knot in throat, nausea, and fatigue.  2. Vitamin D deficiency Jenniferann denies nausea, vomiting, and muscle weakness but notes fatigue. Pt is on prescription Vit D.  3. At risk for side effect of medication Kristin Blanchard is at risk for side effects if medication due to increasing Lexapro.  Assessment/Plan:   1. Anxiety and depression Behavior modification techniques were discussed today to help Earl deal with her anxiety.  Orders and follow up as documented in patient record. Behavior modification techniques were discussed today to help Tyia deal with her emotional/non-hunger eating behaviors.  Orders and follow up as documented in patient record.  -Increase Lexapro to 20 mg PO QD, as prescribed below. - escitalopram (LEXAPRO) 20 MG tablet; Take 1 tablet (20 mg total) by  mouth daily.  Dispense: 90 tablet; Refill: 0  2. Vitamin D deficiency Low Vitamin D level contributes to fatigue and are associated with obesity, breast, and colon cancer. Kristin Blanchard agrees to continue to take prescription Vitamin D @50 ,000 IU every week and will follow-up for routine testing of Vitamin D, at least 2-3 times per year to avoid over-replacement.  3. At risk for side effect of medication Kristin Blanchard was given approximately 15 minutes of drug side effect counseling today.  We discussed side effect possibility and risk versus benefits. Kristin Blanchard agreed to the medication and will contact this office if these side effects are intolerable.  Repetitive spaced learning was employed today to elicit superior memory formation and behavioral change.  4. Class 3 severe obesity with serious comorbidity and body mass index (BMI) of 45.0 to 49.9 in adult, unspecified obesity type (HCC) Kristin Blanchard is currently in the action stage of change. As such, her goal is to continue with weight loss efforts. Kristin Blanchard has agreed to practicing portion control and making smarter food choices, such as increasing vegetables and decreasing simple carbohydrates.   Exercise goals:  As is  Behavioral modification strategies: increasing lean protein intake, meal planning and cooking strategies, keeping healthy foods in the home and travel eating strategies.  Kristin Blanchard has agreed to follow-up with our clinic in 10-12 weeks. Kristin Blanchard was informed of the importance of frequent follow-up visits to maximize her success with intensive lifestyle modifications for her multiple health conditions.   Objective:   Blood pressure 110/73,  pulse 86, temperature 97.9 F (36.6 C), height 5' (1.524 m), weight 243 lb (110.2 kg), SpO2 97 %. Body mass index is 47.46 kg/m.  General: Cooperative, alert, well developed, in no acute distress. HEENT: Conjunctivae and lids unremarkable. Cardiovascular: Regular rhythm.  Lungs: Normal work of breathing. Neurologic: No focal  deficits.   Lab Results  Component Value Date   CREATININE 0.65 07/25/2020   BUN 10 07/25/2020   NA 142 07/25/2020   K 5.0 07/25/2020   CL 105 07/25/2020   CO2 22 07/25/2020   Lab Results  Component Value Date   ALT 18 07/25/2020   AST 19 07/25/2020   ALKPHOS 92 07/25/2020   BILITOT <0.2 07/25/2020   Lab Results  Component Value Date   HGBA1C 5.5 07/25/2020   HGBA1C 5.2 08/10/2016   HGBA1C 5.1 03/09/2016   HGBA1C 5.2 11/15/2014   Lab Results  Component Value Date   INSULIN 23.1 07/25/2020   Lab Results  Component Value Date   TSH 1.510 07/25/2020   Lab Results  Component Value Date   CHOL 205 (H) 07/25/2020   HDL 71 07/25/2020   LDLCALC 122 (H) 07/25/2020   TRIG 66 07/25/2020   CHOLHDL 2.4 03/09/2016   Lab Results  Component Value Date   WBC 4.0 07/25/2020   HGB 12.6 07/25/2020   HCT 38.9 07/25/2020   MCV 90 07/25/2020   PLT 361 07/25/2020   No results found for: IRON, TIBC, FERRITIN  Attestation Statements:   Reviewed by clinician on day of visit: allergies, medications, problem list, medical history, surgical history, family history, social history, and previous encounter notes.  Edmund Hilda, CMA, am acting as transcriptionist for Reuben Likes, MD.   I have reviewed the above documentation for accuracy and completeness, and I agree with the above. - Katherina Mires, MD

## 2021-01-10 ENCOUNTER — Encounter (INDEPENDENT_AMBULATORY_CARE_PROVIDER_SITE_OTHER): Payer: Self-pay | Admitting: Family Medicine

## 2021-01-10 ENCOUNTER — Ambulatory Visit (INDEPENDENT_AMBULATORY_CARE_PROVIDER_SITE_OTHER): Payer: 59 | Admitting: Family Medicine

## 2021-01-10 ENCOUNTER — Other Ambulatory Visit: Payer: Self-pay

## 2021-01-10 VITALS — BP 103/69 | HR 85 | Temp 97.9°F | Ht 60.0 in | Wt 244.0 lb

## 2021-01-10 DIAGNOSIS — J3089 Other allergic rhinitis: Secondary | ICD-10-CM | POA: Diagnosis not present

## 2021-01-10 DIAGNOSIS — F419 Anxiety disorder, unspecified: Secondary | ICD-10-CM

## 2021-01-10 DIAGNOSIS — F32A Depression, unspecified: Secondary | ICD-10-CM

## 2021-01-10 DIAGNOSIS — E559 Vitamin D deficiency, unspecified: Secondary | ICD-10-CM

## 2021-01-10 DIAGNOSIS — Z6841 Body Mass Index (BMI) 40.0 and over, adult: Secondary | ICD-10-CM

## 2021-01-10 DIAGNOSIS — Z9189 Other specified personal risk factors, not elsewhere classified: Secondary | ICD-10-CM | POA: Diagnosis not present

## 2021-01-10 MED ORDER — CETIRIZINE HCL 10 MG PO TABS: 10.0000 mg | ORAL_TABLET | Freq: Every day | ORAL | 0 refills | Status: DC

## 2021-01-10 MED ORDER — ESCITALOPRAM OXALATE 20 MG PO TABS: 20.0000 mg | ORAL_TABLET | Freq: Every day | ORAL | 0 refills | Status: DC

## 2021-01-10 MED ORDER — VITAMIN D (ERGOCALCIFEROL) 1.25 MG (50000 UNIT) PO CAPS: 50000.0000 [IU] | ORAL_CAPSULE | ORAL | 0 refills | Status: DC

## 2021-01-11 NOTE — Progress Notes (Signed)
Chief Complaint:   OBESITY Remedy is here to discuss her progress with her obesity treatment plan along with follow-up of her obesity related diagnoses. Kristin Blanchard is on practicing portion control and making smarter food choices, such as increasing vegetables and decreasing simple carbohydrates and states she is following her eating plan approximately 30% of the time. Kristin Blanchard states she is walking 30 minutes 1 times per week.  Today's visit was #: 8 Starting weight: 244 lbs Starting date: 07/25/2020 Today's weight: 244 lbs Today's date: 01/10/2021 Total lbs lost to date: 0 Total lbs lost since last in-office visit: 0  Interim History: Kristin Blanchard had COVID while in [place] over the summer. She tried to finish off classes and has graduated. She is now trying to find a full time job. She wants to commit to 3 meals a day. She realizes she is quilting herself about food. She is often substituting 2 meals for a protein shake.  Subjective:   1. Vitamin D deficiency Kristin Blanchard denies nausea, vomiting, and muscle weakness but notes fatigue. Pt is on prescription Vit D.  2. Non-seasonal allergic rhinitis due to other allergic trigger She is on Zyrtec with good control of symptoms.  3. Anxiety and depression Kristin Blanchard is on Lexapro 20 mg and Buspar. Pt denies suicidal or homicidal ideations.  4. At risk for osteoporosis Kristin Blanchard is at higher risk of osteopenia and osteoporosis due to Vitamin D deficiency.   Assessment/Plan:   1. Vitamin D deficiency Low Vitamin D level contributes to fatigue and are associated with obesity, breast, and colon cancer. She agrees to continue to take prescription Vitamin D 50,000 IU every week and will follow-up for routine testing of Vitamin D, at least 2-3 times per year to avoid over-replacement.  Refill- Vitamin D, Ergocalciferol, (DRISDOL) 1.25 MG (50000 UNIT) CAPS capsule; Take 1 capsule (50,000 Units total) by mouth every 7 (seven) days.  Dispense: 4 capsule; Refill: 0  2.  Non-seasonal allergic rhinitis due to other allergic trigger Continue Zyrtec 10 mg as directed.   Refill- cetirizine (ZYRTEC) 10 MG tablet; Take 1 tablet (10 mg total) by mouth daily.  Dispense: 30 tablet; Refill: 0  3. Anxiety and depression Behavior modification techniques were discussed today to help Kristin Blanchard deal with her anxiety.  Orders and follow up as documented in patient record.   Refill- escitalopram (LEXAPRO) 20 MG tablet; Take 1 tablet (20 mg total) by mouth daily.  Dispense: 30 tablet; Refill: 0  4. At risk for osteoporosis Kristin Blanchard was given approximately 15 minutes of osteoporosis prevention counseling today. Kristin Blanchard is at risk for osteopenia and osteoporosis due to her Vitamin D deficiency. She was encouraged to take her Vitamin D and follow her higher calcium diet and increase strengthening exercise to help strengthen her bones and decrease her risk of osteopenia and osteoporosis.  Repetitive spaced learning was employed today to elicit superior memory formation and behavioral change.  5. Obesity with current BMI of 47.7  Kristin Blanchard is currently in the action stage of change. As such, her goal is to continue with weight loss efforts. She has agreed to practicing portion control and making smarter food choices, such as increasing vegetables and decreasing simple carbohydrates.   Exercise goals: All adults should avoid inactivity. Some physical activity is better than none, and adults who participate in any amount of physical activity gain some health benefits.  Behavioral modification strategies: increasing lean protein intake, meal planning and cooking strategies, keeping healthy foods in the home, and planning for  success.  Kristin Blanchard has agreed to follow-up with our clinic in 3 weeks. She was informed of the importance of frequent follow-up visits to maximize her success with intensive lifestyle modifications for her multiple health conditions.   Objective:   Blood pressure 103/69, pulse 85,  temperature 97.9 F (36.6 C), height 5' (1.524 m), weight 244 lb (110.7 kg), last menstrual period 01/09/2021, SpO2 99 %. Body mass index is 47.65 kg/m.  General: Cooperative, alert, well developed, in no acute distress. HEENT: Conjunctivae and lids unremarkable. Cardiovascular: Regular rhythm.  Lungs: Normal work of breathing. Neurologic: No focal deficits.   Lab Results  Component Value Date   CREATININE 0.65 07/25/2020   BUN 10 07/25/2020   NA 142 07/25/2020   K 5.0 07/25/2020   CL 105 07/25/2020   CO2 22 07/25/2020   Lab Results  Component Value Date   ALT 18 07/25/2020   AST 19 07/25/2020   ALKPHOS 92 07/25/2020   BILITOT <0.2 07/25/2020   Lab Results  Component Value Date   HGBA1C 5.5 07/25/2020   HGBA1C 5.2 08/10/2016   HGBA1C 5.1 03/09/2016   HGBA1C 5.2 11/15/2014   Lab Results  Component Value Date   INSULIN 23.1 07/25/2020   Lab Results  Component Value Date   TSH 1.510 07/25/2020   Lab Results  Component Value Date   CHOL 205 (H) 07/25/2020   HDL 71 07/25/2020   LDLCALC 122 (H) 07/25/2020   TRIG 66 07/25/2020   CHOLHDL 2.4 03/09/2016   Lab Results  Component Value Date   VD25OH 17.0 (L) 07/25/2020   VD25OH 14 (L) 05/07/2016   VD25OH 14 (L) 03/09/2016   Lab Results  Component Value Date   WBC 4.0 07/25/2020   HGB 12.6 07/25/2020   HCT 38.9 07/25/2020   MCV 90 07/25/2020   PLT 361 07/25/2020    Attestation Statements:   Reviewed by clinician on day of visit: allergies, medications, problem list, medical history, surgical history, family history, social history, and previous encounter notes.  Edmund Hilda, CMA, am acting as transcriptionist for Reuben Likes, MD.   I have reviewed the above documentation for accuracy and completeness, and I agree with the above. - Reuben Likes, MD

## 2021-01-31 ENCOUNTER — Encounter (INDEPENDENT_AMBULATORY_CARE_PROVIDER_SITE_OTHER): Payer: Self-pay | Admitting: Family Medicine

## 2021-01-31 ENCOUNTER — Ambulatory Visit (INDEPENDENT_AMBULATORY_CARE_PROVIDER_SITE_OTHER): Payer: 59 | Admitting: Family Medicine

## 2021-01-31 ENCOUNTER — Other Ambulatory Visit: Payer: Self-pay

## 2021-01-31 VITALS — BP 101/70 | HR 72 | Temp 97.8°F | Ht 60.0 in | Wt 248.0 lb

## 2021-01-31 DIAGNOSIS — F419 Anxiety disorder, unspecified: Secondary | ICD-10-CM | POA: Diagnosis not present

## 2021-01-31 DIAGNOSIS — E559 Vitamin D deficiency, unspecified: Secondary | ICD-10-CM

## 2021-01-31 DIAGNOSIS — Z6841 Body Mass Index (BMI) 40.0 and over, adult: Secondary | ICD-10-CM

## 2021-01-31 DIAGNOSIS — Z9189 Other specified personal risk factors, not elsewhere classified: Secondary | ICD-10-CM | POA: Diagnosis not present

## 2021-01-31 DIAGNOSIS — J3089 Other allergic rhinitis: Secondary | ICD-10-CM | POA: Diagnosis not present

## 2021-01-31 DIAGNOSIS — F32A Depression, unspecified: Secondary | ICD-10-CM

## 2021-01-31 MED ORDER — CETIRIZINE HCL 10 MG PO TABS: 10.0000 mg | ORAL_TABLET | Freq: Every day | ORAL | 0 refills | Status: DC

## 2021-01-31 MED ORDER — VITAMIN D (ERGOCALCIFEROL) 1.25 MG (50000 UNIT) PO CAPS: 50000.0000 [IU] | ORAL_CAPSULE | ORAL | 0 refills | Status: DC

## 2021-01-31 MED ORDER — ESCITALOPRAM OXALATE 20 MG PO TABS: 20.0000 mg | ORAL_TABLET | Freq: Every day | ORAL | 0 refills | Status: DC

## 2021-01-31 NOTE — Progress Notes (Signed)
Chief Complaint:   OBESITY Kristin Blanchard is here to discuss her progress with her obesity treatment plan along with follow-up of her obesity related diagnoses. Kristin Blanchard is on practicing portion control and making smarter food choices, such as increasing vegetables and decreasing simple carbohydrates and states she is following her eating plan approximately 35% of the time. Kristin Blanchard states she is walking 15,000 steps 3 times per week.  Today's visit was #: 9 Starting weight: 244 lbs Starting date: 07/25/2020 Today's weight: 248 lbs Today's date: 01/31/2021 Total lbs lost to date: 0 Total lbs lost since last in-office visit: 0  Interim History: Kristin Blanchard felt and increase in pressure with being on meal plan. Last week, she tried to get all 3 meals in, and this week she is focusing on getting 85 grams protein. Yesterday she struggled getting 85 grams in.  Subjective:   1. Non-seasonal allergic rhinitis due to other allergic trigger Kristin Blanchard is on Zyrtec with good control of symptoms.  2. Anxiety and depression Pt denies suicidal or homicidal ideations. She is on Lexapro with some improvement in symptoms.  3. Vitamin D deficiency Kristin Blanchard denies nausea, vomiting, and muscle weakness but notes fatigue. Pt is on prescription Vit D.  4. At risk for deficient intake of food Kristin Blanchard is at risk for deficient intake of food due to often skipping 1 meal a day.  Assessment/Plan:   1. Non-seasonal allergic rhinitis due to other allergic trigger Continue Zyrtec 10 mg daily.  Refill- cetirizine (ZYRTEC) 10 MG tablet; Take 1 tablet (10 mg total) by mouth daily.  Dispense: 90 tablet; Refill: 0  2. Anxiety and depression Behavior modification techniques were discussed today to help Kristin Blanchard deal with her anxiety.  Orders and follow up as documented in patient record.  Refer to Dr. Dewaine Conger.  Refill- escitalopram (LEXAPRO) 20 MG tablet; Take 1 tablet (20 mg total) by mouth daily.  Dispense: 30 tablet; Refill: 0  3. Vitamin D  deficiency Low Vitamin D level contributes to fatigue and are associated with obesity, breast, and colon cancer. She agrees to continue to take prescription Vitamin D 50,000 IU every week and will follow-up for routine testing of Vitamin D, at least 2-3 times per year to avoid over-replacement.  Refill- Vitamin D, Ergocalciferol, (DRISDOL) 1.25 MG (50000 UNIT) CAPS capsule; Take 1 capsule (50,000 Units total) by mouth every 7 (seven) days.  Dispense: 4 capsule; Refill: 0  4. At risk for deficient intake of food Kristin Blanchard was given approximately 15 minutes of deficit intake of food prevention counseling today. Kristin Blanchard is at risk for eating too few calories based on current food recall. She was encouraged to focus on meeting caloric and protein goals according to her recommended meal plan.    5. Obesity with current BMI of 48.5  Kristin Blanchard is currently in the action stage of change. As such, her goal is to continue with weight loss efforts. She has agreed to keeping a food journal and adhering to recommended goals of 1350 calories and 85 grams protein.   Exercise goals: All adults should avoid inactivity. Some physical activity is better than none, and adults who participate in any amount of physical activity gain some health benefits.  Behavioral modification strategies: increasing lean protein intake, meal planning and cooking strategies, keeping healthy foods in the home, and planning for success.  Kristin Blanchard has agreed to follow-up with our clinic in 2 weeks. She was informed of the importance of frequent follow-up visits to maximize her success with intensive  lifestyle modifications for her multiple health conditions.   Objective:   Blood pressure 101/70, pulse 72, temperature 97.8 F (36.6 C), height 5' (1.524 m), weight 248 lb (112.5 kg), last menstrual period 01/09/2021, SpO2 99 %. Body mass index is 48.43 kg/m.  General: Cooperative, alert, well developed, in no acute distress. HEENT: Conjunctivae and  lids unremarkable. Cardiovascular: Regular rhythm.  Lungs: Normal work of breathing. Neurologic: No focal deficits.   Lab Results  Component Value Date   CREATININE 0.65 07/25/2020   BUN 10 07/25/2020   NA 142 07/25/2020   K 5.0 07/25/2020   CL 105 07/25/2020   CO2 22 07/25/2020   Lab Results  Component Value Date   ALT 18 07/25/2020   AST 19 07/25/2020   ALKPHOS 92 07/25/2020   BILITOT <0.2 07/25/2020   Lab Results  Component Value Date   HGBA1C 5.5 07/25/2020   HGBA1C 5.2 08/10/2016   HGBA1C 5.1 03/09/2016   HGBA1C 5.2 11/15/2014   Lab Results  Component Value Date   INSULIN 23.1 07/25/2020   Lab Results  Component Value Date   TSH 1.510 07/25/2020   Lab Results  Component Value Date   CHOL 205 (H) 07/25/2020   HDL 71 07/25/2020   LDLCALC 122 (H) 07/25/2020   TRIG 66 07/25/2020   CHOLHDL 2.4 03/09/2016   Lab Results  Component Value Date   VD25OH 17.0 (L) 07/25/2020   VD25OH 14 (L) 05/07/2016   VD25OH 14 (L) 03/09/2016   Lab Results  Component Value Date   WBC 4.0 07/25/2020   HGB 12.6 07/25/2020   HCT 38.9 07/25/2020   MCV 90 07/25/2020   PLT 361 07/25/2020    Attestation Statements:   Reviewed by clinician on day of visit: allergies, medications, problem list, medical history, surgical history, family history, social history, and previous encounter notes.  Edmund Hilda, CMA, am acting as transcriptionist for Reuben Likes, MD.   I have reviewed the above documentation for accuracy and completeness, and I agree with the above. - Reuben Likes, MD
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
## 2021-02-14 ENCOUNTER — Telehealth (INDEPENDENT_AMBULATORY_CARE_PROVIDER_SITE_OTHER): Payer: 59 | Admitting: Psychology

## 2021-02-16 ENCOUNTER — Ambulatory Visit (INDEPENDENT_AMBULATORY_CARE_PROVIDER_SITE_OTHER): Payer: 59 | Admitting: Family Medicine

## 2021-02-16 ENCOUNTER — Encounter (INDEPENDENT_AMBULATORY_CARE_PROVIDER_SITE_OTHER): Payer: Self-pay | Admitting: Family Medicine

## 2021-02-16 ENCOUNTER — Other Ambulatory Visit: Payer: Self-pay

## 2021-02-16 VITALS — BP 136/78 | HR 71 | Temp 98.0°F | Ht 60.0 in | Wt 248.0 lb

## 2021-02-16 DIAGNOSIS — Z9189 Other specified personal risk factors, not elsewhere classified: Secondary | ICD-10-CM | POA: Diagnosis not present

## 2021-02-16 DIAGNOSIS — J3089 Other allergic rhinitis: Secondary | ICD-10-CM

## 2021-02-16 DIAGNOSIS — F32A Depression, unspecified: Secondary | ICD-10-CM

## 2021-02-16 DIAGNOSIS — F419 Anxiety disorder, unspecified: Secondary | ICD-10-CM

## 2021-02-16 DIAGNOSIS — Z6841 Body Mass Index (BMI) 40.0 and over, adult: Secondary | ICD-10-CM

## 2021-02-16 MED ORDER — ESCITALOPRAM OXALATE 20 MG PO TABS: 20.0000 mg | ORAL_TABLET | Freq: Every day | ORAL | 0 refills | Status: DC

## 2021-02-16 NOTE — Progress Notes (Signed)
Chief Complaint:   OBESITY Kristin Blanchard is here to discuss her progress with her obesity treatment plan along with follow-up of her obesity related diagnoses. Kristin Blanchard is on keeping a food journal and adhering to recommended goals of 1350 calories and 85 grams protein and states she is following her eating plan approximately 50% of the time. Kristin Blanchard states she is not currently exercising.  Today's visit was #: 10 Starting weight: 244 lbs Starting date: 07/25/2020 Today's weight: 248 lbs Today's date: 02/16/2021 Total lbs lost to date: 0 Total lbs lost since last in-office visit: 0  Interim History: Kristin Blanchard has been hitting 85 grams of protein , except for the last 4 days. She has gone over on calories due to sweets. Pt recognizes yesterday was harder to stay within calories. Pt is noticing with eating more protein that she is consistently full. She has no upcoming plans, except trip to [place] to visit her brother.  Subjective:   1. Anxiety and depression Pt denies suicidal or homicidal ideations. Alaze is on Lexapro with some control of symptoms but still feeling some angst about transition between school and work.  2. Allergic rhinitis due to other allergic trigger, unspecified seasonality She is on Zyrtec. Pt has Flonase but doesn't use it. Increase in symptoms recently.  3. At risk for activity intolerance Ikeisha is at risk for exercise intolerance due to not exercising currently.  Assessment/Plan:   1. Anxiety and depression Behavior modification techniques were discussed today to help Kristin Blanchard deal with her anxiety.  Orders and follow up as documented in patient record.   Refill- escitalopram (LEXAPRO) 20 MG tablet; Take 1 tablet (20 mg total) by mouth daily.  Dispense: 90 tablet; Refill: 0  2. Allergic rhinitis due to other allergic trigger, unspecified seasonality Discussed first line of treatment for allergies if Flonase, then H2 blocker.  3. At risk for activity intolerance Kristin Blanchard was given  approximately 15 minutes of exercise intolerance counseling today. She is 25 y.o. female and has risk factors exercise intolerance including obesity. We discussed intensive lifestyle modifications today with an emphasis on specific weight loss instructions and strategies. Suhaylah will slowly increase activity as tolerated.  Repetitive spaced learning was employed today to elicit superior memory formation and behavioral change.   4. Obesity with current BMI of   Kristin Blanchard is currently in the action stage of change. As such, her goal is to continue with weight loss efforts. She has agreed to keeping a food journal and adhering to recommended goals of 1350 calories and 85+ grams protein.   Exercise goals: No exercise has been prescribed at this time. Pt is to think about starting yoga.  Behavioral modification strategies: increasing lean protein intake, better snacking choices, emotional eating strategies, and keeping a strict food journal.  Kristin Blanchard has agreed to follow-up with our clinic in 3 weeks. She was informed of the importance of frequent follow-up visits to maximize her success with intensive lifestyle modifications for her multiple health conditions.   Objective:   Blood pressure 136/78, pulse 71, temperature 98 F (36.7 C), height 5' (1.524 m), weight 248 lb (112.5 kg), last menstrual period 02/11/2021, SpO2 98 %. Body mass index is 48.43 kg/m.  General: Cooperative, alert, well developed, in no acute distress. HEENT: Conjunctivae and lids unremarkable. Cardiovascular: Regular rhythm.  Lungs: Normal work of breathing. Neurologic: No focal deficits.   Lab Results  Component Value Date   CREATININE 0.65 07/25/2020   BUN 10 07/25/2020   NA 142 07/25/2020  K 5.0 07/25/2020   CL 105 07/25/2020   CO2 22 07/25/2020   Lab Results  Component Value Date   ALT 18 07/25/2020   AST 19 07/25/2020   ALKPHOS 92 07/25/2020   BILITOT <0.2 07/25/2020   Lab Results  Component Value Date    HGBA1C 5.5 07/25/2020   HGBA1C 5.2 08/10/2016   HGBA1C 5.1 03/09/2016   HGBA1C 5.2 11/15/2014   Lab Results  Component Value Date   INSULIN 23.1 07/25/2020   Lab Results  Component Value Date   TSH 1.510 07/25/2020   Lab Results  Component Value Date   CHOL 205 (H) 07/25/2020   HDL 71 07/25/2020   LDLCALC 122 (H) 07/25/2020   TRIG 66 07/25/2020   CHOLHDL 2.4 03/09/2016   Lab Results  Component Value Date   VD25OH 17.0 (L) 07/25/2020   VD25OH 14 (L) 05/07/2016   VD25OH 14 (L) 03/09/2016   Lab Results  Component Value Date   WBC 4.0 07/25/2020   HGB 12.6 07/25/2020   HCT 38.9 07/25/2020   MCV 90 07/25/2020   PLT 361 07/25/2020    Attestation Statements:   Reviewed by clinician on day of visit: allergies, medications, problem list, medical history, surgical history, family history, social history, and previous encounter notes.  Edmund Hilda, CMA, am acting as transcriptionist for Reuben Likes, MD.  I have reviewed the above documentation for accuracy and completeness, and I agree with the above. - Reuben Likes, MD

## 2021-02-19 ENCOUNTER — Other Ambulatory Visit (INDEPENDENT_AMBULATORY_CARE_PROVIDER_SITE_OTHER): Payer: Self-pay | Admitting: Family Medicine

## 2021-02-19 DIAGNOSIS — J3089 Other allergic rhinitis: Secondary | ICD-10-CM

## 2021-02-28 NOTE — Progress Notes (Signed)
Office: [phone]  /  Fax: [phone]    Date: March 14, 2021   Appointment Start Time: 3:02pm Duration: 65 minutes Provider: Lawerance Cruel, Psy.D. Type of Session: Intake for Individual Therapy  Location of Patient: Car in [place] (not driving) Location of Provider: Provider's home (private office) Type of Contact: Telepsychological Visit via MyChart Video Visit  Informed Consent: Prior to proceeding with today's appointment, two pieces of identifying information were obtained. In addition, Caera's physical location at the time of this appointment was obtained as well a phone number she could be reached at in the event of technical difficulties. Dalanie and this provider participated in today's telepsychological service.   The provider's role was explained to [employer]. The provider reviewed and discussed issues of confidentiality, privacy, and limits therein (e.g., reporting obligations). In addition to verbal informed consent, written informed consent for psychological services was obtained prior to the initial appointment. Since the clinic is not a 24/7 crisis center, mental health emergency resources were shared and this  provider explained MyChart, e-mail, voicemail, and/or other messaging systems should be utilized only for non-emergency reasons. This provider also explained that information obtained during appointments will be placed in Keliah's medical record and relevant information will be shared with other providers at [facility] for coordination of care. Kieana agreed information may be shared with other [facility] providers as needed for coordination of care and by signing the service agreement document, she provided written consent for coordination of care. Prior to initiating telepsychological services, Tallula completed an informed consent document, which included the development of a safety plan (i.e., an emergency contact and emergency resources) in  the event of an emergency/crisis. Jenine verbally acknowledged understanding she is ultimately responsible for understanding her insurance benefits for telepsychological and in-person services. This provider also reviewed confidentiality, as it relates to telepsychological services, as well as the rationale for telepsychological services (i.e., to reduce exposure risk to COVID-19). Lakeasha  acknowledged understanding that appointments cannot be recorded without both party consent and she is aware she is responsible for securing confidentiality on her end of the session. Zita verbally consented to proceed.  Of note, Irie shared she was in the passenger seat of the car while her sister Edia Pursifull) was driving them home. This provider explained the limits of confidentiality as it relates to someone else being present. She acknowledged understanding and provided verbal consent to proceed. She put on headphones for the appointment. At 3:24pm she reached home, and she was in a private room for the duration of the appointment.   Chief Complaint/HPI: Kristin Blanchard was referred by Dr. Reuben Likes due to  anxiety and depression . Per the note for the visit with Dr. Reuben Likes on January 31, 2021, "Pt denies suicidal or homicidal ideations. She is on Lexapro with some improvement in symptoms." The note for the initial appointment with Dr. Reuben Likes on July 25, 2020 indicated the following: "Her family eats meals together, her desired weight loss is 94 lbs, she has been heavy most of her life, she started gaining weight in college, her heaviest weight ever was 240 pounds, she is a picky eater and doesn't like to eat healthier foods, she has significant food cravings issues, she snacks frequently in the evenings, she skips meals frequently, she is frequently drinking liquids with calories, she frequently makes poor food choices, she has problems with excessive hunger and she struggles with emotional eating."  Nathan's Food and Mood (modified PHQ-9) score  on July 25, 2020 was 12.  During today's appointment, Nehemiah reported, "I feel like I've never had a healthy relationship with food." She discussed a history of calorie counting (1,200), adding she would "aim for less than that." She noted she used to enjoy eating food, but now feels "bad" when she eats something. Since starting with the clinic, Beauty reported, "It's been a Energy manager." She explained she is journaling, noting journaling calories and protein is challenging. Aiya denied a history of purging and engagement in other compensatory strategies for weight loss, and has never been diagnosed with an eating disorder. She also denied a history of treatment for emotional eating. Presleigh was verbally administered a questionnaire assessing various behaviors related to emotional eating behaviors. Nyajah endorsed the following: overeat when you are celebrating, experience food cravings on a regular basis, overeat when you are alone, but eat much less when you are with other people, and eat as a reward. She shared she craves sweets "all the time." Shanta believes the onset of emotional eating behaviors was likely in childhood. She added, "I try to not eat as much as possible." However, she reported she works toward reaching her calorie or protein goal since starting with the clinic. In addition, Baylin denied a history of binge eating behaviors. Furthermore, Lety denied other problems of concern.    Mental Status Examination:  Appearance: well groomed and appropriate hygiene  Behavior: appropriate to circumstances Mood: anxious Affect: mood congruent; tearful throughout the appointment Speech: normal in rate, volume, and tone Eye Contact: appropriate Psychomotor Activity: appropriate  Gait: unable to assess  Thought Process: linear, logical, and goal directed  Thought Content/Perception: denies suicidal and homicidal ideation, plan, and intent, no hallucinations,  delusions, bizarre thinking or behavior reported or observed, and denies ideation and engagement in self-injurious behaviors Orientation: time, person, place, and purpose of appointment Memory/Concentration: memory, attention, language, and fund of knowledge intact  Insight/Judgment: fair  Family & Psychosocial History: Jovie reported she is not in a relationship and she does not have any children. 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 Additionally, Meggin shared her highest level of education obtained is a bachelor's degree. Currently, Adalie's social support system consists of her parents and siblings. Moreover, Eufemia stated she resides with her parents and siblings (3).   Medical History:  Past Medical History:  Diagnosis Date   Allergic rhinitis    Anxiety    Asthma    Depression    Headache    PCOS (polycystic ovarian syndrome)    Vitamin D deficiency    Past Surgical History:  Procedure Laterality Date   NO PAST SURGERIES     WISDOM TOOTH EXTRACTION     Current Outpatient Medications on File Prior to Visit  Medication Sig Dispense Refill   albuterol (VENTOLIN HFA) 108 (90 Base) MCG/ACT inhaler Inhale 2 puffs into the lungs every 4 hours as needed to treat wheezes, cough, shortness of breath 1 each 1   busPIRone (BUSPAR) 7.5 MG tablet Take 1 tablet (7.5 mg total) by mouth 3 (three) times daily as needed (anxiety). 90 tablet 0   cetirizine (ZYRTEC) 10 MG tablet Take 1 tablet (10 mg total) by mouth daily. 90 tablet 0   escitalopram (LEXAPRO) 20 MG tablet Take 1 tablet (20 mg total) by mouth daily. 90 tablet 0   hydrOXYzine (VISTARIL) 25 MG capsule Take 1 capsule (25 mg total) by mouth 3 (three) times daily as needed for anxiety. 30 capsule 0  Vitamin D, Ergocalciferol, (DRISDOL) 1.25 MG (50000 UNIT) CAPS capsule Take 1 capsule (50,000 Units total) by mouth every 7 (seven) days. 4 capsule 0   No current facility-administered medications on file prior  to visit.  Medication compliant.   Mental Health History: Elanda reported she previously attended therapeutic services during college to address symptoms of anxiety. She shared she is in the process of finding a provider. She shared Dr. Lawson Radar is prescribing Buspar and Lexapro. She recalled she was prescribed Vistaril when she went to urgent care in May 2022 due to increased anxiety/panic. More specifically, she recalled she started yelling at her family and did not feel like herself; therefore, she asked her sister to call 911. Vernida reported there is no history of hospitalizations for psychiatric concerns. Jannatul reported a family history of bipolar disorder (brother) and ADHD (brother and younger sister). She denied a family history of substance abuse. Aliyanah reported there is no history of trauma including psychological, physical , and sexual abuse, as well as neglect.  Charliene described experiencing passive suicidal ideation ("I don't want to be here, but I know I don't want to be gone."). She further reported, "Sometimes I think about what if I'm driving my car and I let go of the wheel. If I get hurt, then people will pay attention to me."  She added that then she would have a "reason" to have people "off [her] back." Caliana explained the aforementioned started two years ago, and described the current frequency as daily. Kerryn denied experiencing suicidal plan and intent. She shared she tells herself "It's going to get better." She also explained she wishes she could "fast forward" to the good parts of life. The following protective factors were identified for Amrutha: future ("I want to lose weight; I want to be healthier. I want to be successful."), friends, and family. If she were to become overwhelmed or feel increasingly anxious in the future, which are warning signs, she identified the following coping skills she could engage in: be with friends; call friends; listen to Kpop; and watch television (e.g., "K  dramas"). It was recommended the aforementioned be written down and developed into a coping card for future reference; she agreed. Psychoeducation regarding the importance of reaching out to a trusted individual and/or utilizing emergency resources if there is a change in emotional status and/or there is an inability to ensure safety was provided. Shamariah's confidence in reaching out to a trusted individual and/or utilizing emergency resources should there be an intensification in emotional status and/or there is an inability to ensure safety was assessed on a scale of one to ten where one is not confident and ten is extremely confident. She reported her confidence is a 10. Additionally, Jordann denied current access to firearms and/or weapons.   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 Aislinn further reported experiencing what if scenarios; ongoing worry about finances, future, well-being of self/others; decreased self-image; social withdrawal since moving back home and recent interpersonal conflict; and high standards for self. She also discussed a history of panic attacks triggered by something someone says and feeling pressure of needing to get something done (e.g., assignments when in school). She described them as infrequent. Xenia further stated she frequently self-diagnosis herself as it "  bring[s] [her] comfort." She indicated she has self-diagnosed herself as meeting criteria for depression and ADHD. Regarding ADHD-related concerns, Erian explained she feels she is tangential when speaking with others; has to listen to music when working; experiences challenges with concentrating; and is forgetful.   Sigrid denied current alcohol use. She denied tobacco use. She  denied illicit/recreational substance use. Regarding caffeine intake, Mardy reported consuming one latte (12oz; 2 shots of espresso) daily, adding she has a matcha sometimes. Furthermore, Adison indicated she is not experiencing the following: hallucinations and delusions, paranoia, symptoms of mania , social withdrawal, crying spells, and obsessions and compulsions. She also denied current suicidal ideation, plan, and intent; history of and current homicidal ideation, plan, and intent; and history of and current engagement in self-harm.  The following strengths were reported by Arna Medici: emotionally intelligent and self-aware. The following strengths were observed by this provider: ability to express thoughts and feelings during the therapeutic session, ability to establish and benefit from a therapeutic relationship, willingness to work toward established goal(s) with the clinic and ability to engage in reciprocal conversation.   Legal History: Antonella reported there is no history of legal involvement.   Structured Assessments Results: The Patient Health Questionnaire-9 (PHQ-9) is a self-report measure that assesses symptoms and severity of depression over the course of the last two weeks. Sharnay obtained a score of 22 suggesting severe depression. Tacoya finds the endorsed symptoms to be extremely difficult. [0= Not at all; 1= Several days; 2= More than half the days; 3= Nearly every day] Little interest or pleasure in doing things 2  Feeling down, depressed, or hopeless 2  Trouble falling or staying asleep, or sleeping too much 3  Feeling tired or having little energy 3  Poor appetite or overeating 3  Feeling bad about yourself --- or that you are a failure or have let yourself or your family down 3  Trouble concentrating on things, such as reading the newspaper or watching television 3  Moving or speaking so slowly that other people could have noticed? Or the opposite --- being so fidgety or restless that you  have been moving around a lot more than usual [just started to notice, not sure if this is her baseline] 3  Thoughts that you would be better off dead or hurting yourself in some way 0  PHQ-9 Score 22    The Generalized Anxiety Disorder-7 (GAD-7) is a brief self-report measure that assesses symptoms of anxiety over the course of the last two weeks. Dorthea obtained a score of 17 suggesting severe anxiety. Kiandria finds the endorsed symptoms to be extremely difficult. [0= Not at all; 1= Several days; 2= Over half the days; 3= Nearly every day] Feeling nervous, anxious, on edge 1  Not being able to stop or control worrying 1  Worrying too much about different things 3  Trouble relaxing 3  Being so restless that it's hard to sit still 3  Becoming easily annoyed or irritable 3  Feeling afraid as if something awful might happen- parents dying and the fear of "things getting worse than this" 3  GAD-7 Score 17   Interventions:  Conducted a chart review Focused on rapport building Verbally administered PHQ-9 and GAD-7 for symptom monitoring Verbally administered Food & Mood questionnaire to assess various behaviors related to emotional eating Provided emphatic reflections and validation Recommended/discussed option for longer-term therapeutic services Conducted a risk assessment Developed a coping card   Provisional DSM-5 Diagnosis(es): F50.9 Unspecified Feeding or Eating Disorder; F41.1  Generalized Anxiety Disorder, F33.1  Major Depressive Disorder, Recurrent Episode, Moderate, and 314.01 (F90.9) Unspecified Attention-Deficit/Hyperactivity Disorder   Plan: Monaye appears able and willing to participate as evidenced by collaboration on a treatment goal, engagement in reciprocal conversation, and asking questions as needed for clarification. The next appointment will be scheduled in two weeks, which will be via MyChart Video Visit. The following treatment goal was established: increase coping skills. This  provider will regularly review the treatment plan and medical chart to keep informed of status changes. Sung expressed understanding and agreement with the initial treatment plan of care. Additionally, Calvin provided verbal consent for this provider to place a referral with [facility] for therapeutic services to address symptoms of depression and anxiety as well as an evaluation to ascertain if she meets criteria for a diagnosis of ADHD. She also provided verbal consent for this provider to e-mail additional referral options. Moreover, it was recommended she call the clinic to re-schedule her appointment with Dr. Lawson Radar; she agreed. Today's appointment concluded with this provider and Junelle discussing what she is looking forward to between now and the next appointment with this provider. She identified the following:  going to get her eye brows threaded; trip to [place] with friends this coming weekend; morning work meetings; and using her new keyboard and mouse.

## 2021-03-06 ENCOUNTER — Telehealth (INDEPENDENT_AMBULATORY_CARE_PROVIDER_SITE_OTHER): Payer: 59 | Admitting: Psychology

## 2021-03-08 ENCOUNTER — Encounter (INDEPENDENT_AMBULATORY_CARE_PROVIDER_SITE_OTHER): Payer: Self-pay

## 2021-03-09 ENCOUNTER — Encounter (INDEPENDENT_AMBULATORY_CARE_PROVIDER_SITE_OTHER): Payer: Self-pay

## 2021-03-09 ENCOUNTER — Ambulatory Visit (INDEPENDENT_AMBULATORY_CARE_PROVIDER_SITE_OTHER): Payer: 59 | Admitting: Family Medicine

## 2021-03-14 ENCOUNTER — Telehealth (INDEPENDENT_AMBULATORY_CARE_PROVIDER_SITE_OTHER): Payer: 59 | Admitting: Psychology

## 2021-03-14 DIAGNOSIS — F331 Major depressive disorder, recurrent, moderate: Secondary | ICD-10-CM

## 2021-03-14 DIAGNOSIS — F909 Attention-deficit hyperactivity disorder, unspecified type: Secondary | ICD-10-CM | POA: Diagnosis not present

## 2021-03-14 DIAGNOSIS — F509 Eating disorder, unspecified: Secondary | ICD-10-CM | POA: Diagnosis not present

## 2021-03-14 DIAGNOSIS — F411 Generalized anxiety disorder: Secondary | ICD-10-CM | POA: Diagnosis not present

## 2021-03-14 NOTE — Progress Notes (Signed)
Office: [phone]  /  Fax: [phone]    Date: March 28, 2021   Appointment Start Time: 2:31pm Duration: 26 minutes Provider: Lawerance Cruel, Psy.D. Type of Session: Individual Therapy  Location of Patient: Parked in car in safe/private location in [place] Location of Provider: Provider's Home (private office) Type of Contact: Telepsychological Visit via MyChart Video Visit  Session Content: Kristin Blanchard is a 25 y.o. female presenting for a follow-up appointment to address the previously established treatment goal of increasing coping skills.Today's appointment was a telepsychological visit due to COVID-19. Kristin Blanchard provided verbal consent for today's telepsychological appointment and she is aware she is responsible for securing confidentiality on her end of the session. Prior to proceeding with today's appointment, Kristin Blanchard's physical location at the time of this appointment was obtained as well a phone number she could be reached at in the event of technical difficulties. Kristin Blanchard and this provider participated in today's telepsychological service.   This provider conducted a brief check-in. Kristin Blanchard shared about her recent trip to [place], noting, "It was fun. It was the best trip in the world." A risk assessment was completed. Kristin Blanchard denied experiencing suicidal and homicidal ideation, plan, and intent since the last appointment with this provider. She reported she continues to acknowledge understanding regarding the importance of reaching out to trusted individuals and/or emergency resources if she is unable to ensure safety.   Regarding eating, Kristin Blanchard reported, "I have no appetite." Thus, she indicated she is "consciously" watching what she eats. This provider discussed the importance of eating regularly and engaged her in problem solving. Kristin Blanchard discussed a plan to make sure she has foods available that she enjoys that are congruent to her meal plan. Additionally, she was receptive to setting reminders on  her phone to eat. Psychoeducation regarding emotional versus physical hunger was provided. Kristin Blanchard was given a handout to utilize between now and the next appointment to increase awareness of hunger patterns and subsequent eating. Kristin Blanchard provided verbal consent during today's appointment for this provider to send a handout about hunger patterns via e-mail. Overall, Kristin Blanchard was receptive to today's appointment as evidenced by openness to sharing, responsiveness to feedback, and willingness to explore emotional and physical hunger patterns.   Mental Status Examination:  Appearance: well groomed and appropriate hygiene  Behavior: appropriate to circumstances Mood: euthymic Affect: mood congruent Speech: normal in rate, volume, and tone Eye Contact: appropriate Psychomotor Activity: appropriate Gait: unable to assess Thought Process: linear, logical, and goal directed  Thought Content/Perception: denies suicidal and homicidal ideation, plan, and intent, no hallucinations, delusions, bizarre thinking or behavior reported or observed, and denies ideation and engagement in self-injurious behaviors Orientation: time, person, place, and purpose of appointment Memory/Concentration: memory, attention, language, and fund of knowledge intact  Insight/Judgment: fair  Interventions:  Conducted a brief chart review Conducted a risk assessment Provided empathic reflections and validation Employed supportive psychotherapy interventions to facilitate reduced distress and to improve coping skills with identified stressors Psychoeducation provided regarding physical versus emotional hunger  DSM-5 Diagnosis(es):  F50.9 Unspecified Feeding or Eating Disorder; F41.1 Generalized Anxiety Disorder, F33.1  Major Depressive Disorder, Recurrent Episode, Moderate, and 314.01 (F90.9) Unspecified Attention-Deficit/Hyperactivity Disorder   Treatment Goal & Progress: During the initial appointment with this provider, the following  treatment goal was established: increase coping skills. Progress is limited, as Jaselle has just begun treatment with this provider; however, she is receptive to the interaction and interventions and rapport is being established.   Plan: The next appointment will be scheduled in  three weeks, which will be via MyChart Video Visit. The next session will focus on working towards the established treatment goal. Kristin Blanchard is scheduled for an initial appointment for therapeutic services on April 11, 2021 with Dr. Bosie Clos and an ADHD evaluation with Dr. Reggy Eye on June 07, 2021 with [facility]. It was again recommended she reach out to the clinic to reschedule her appointment with Dr. Lawson Radar.

## 2021-03-28 ENCOUNTER — Telehealth (INDEPENDENT_AMBULATORY_CARE_PROVIDER_SITE_OTHER): Payer: 59 | Admitting: Psychology

## 2021-03-28 DIAGNOSIS — F909 Attention-deficit hyperactivity disorder, unspecified type: Secondary | ICD-10-CM | POA: Diagnosis not present

## 2021-03-28 DIAGNOSIS — F331 Major depressive disorder, recurrent, moderate: Secondary | ICD-10-CM | POA: Diagnosis not present

## 2021-03-28 DIAGNOSIS — F411 Generalized anxiety disorder: Secondary | ICD-10-CM | POA: Diagnosis not present

## 2021-03-28 DIAGNOSIS — F509 Eating disorder, unspecified: Secondary | ICD-10-CM | POA: Diagnosis not present

## 2021-03-29 ENCOUNTER — Other Ambulatory Visit: Payer: Self-pay

## 2021-03-29 ENCOUNTER — Encounter (INDEPENDENT_AMBULATORY_CARE_PROVIDER_SITE_OTHER): Payer: Self-pay | Admitting: Family Medicine

## 2021-03-29 ENCOUNTER — Ambulatory Visit (INDEPENDENT_AMBULATORY_CARE_PROVIDER_SITE_OTHER): Payer: 59 | Admitting: Family Medicine

## 2021-03-29 VITALS — BP 120/90 | HR 72 | Temp 98.1°F | Ht 60.0 in | Wt 252.0 lb

## 2021-03-29 DIAGNOSIS — F32A Depression, unspecified: Secondary | ICD-10-CM

## 2021-03-29 DIAGNOSIS — Z6841 Body Mass Index (BMI) 40.0 and over, adult: Secondary | ICD-10-CM

## 2021-03-29 DIAGNOSIS — F419 Anxiety disorder, unspecified: Secondary | ICD-10-CM

## 2021-03-29 DIAGNOSIS — E559 Vitamin D deficiency, unspecified: Secondary | ICD-10-CM

## 2021-03-29 DIAGNOSIS — E669 Obesity, unspecified: Secondary | ICD-10-CM

## 2021-03-29 MED ORDER — VITAMIN D (ERGOCALCIFEROL) 1.25 MG (50000 UNIT) PO CAPS: 50000.0000 [IU] | ORAL_CAPSULE | ORAL | 0 refills | Status: DC

## 2021-03-30 NOTE — Progress Notes (Signed)
Chief Complaint:   OBESITY Kristin Blanchard is here to discuss her progress with her obesity treatment plan along with follow-up of her obesity related diagnoses. Kristin Blanchard is on keeping a food journal and adhering to recommended goals of 1350 calories and 85+ grams protein and states she is following her eating plan approximately 40% of the time. Kristin Blanchard states she is not currently exercising.  Today's visit was #: 11 Starting weight: 244 lbs Starting date: 07/25/2020 Today's weight: 252 lbs Today's date: 03/29/2021 Total lbs lost to date: 0 Total lbs lost since last in-office visit: 0  Interim History: Kristin Blanchard is noticing a decline in appetite and feels she is almost never hungry. She drinks coffee during the day, then has dinner with her family. She recently got a membership at [facility] and is hoping this will kick start her to eat more frequently. She felt bored and difficulty with structured plan.  Subjective:   1. Vitamin D deficiency Pt denies nausea, vomiting, and muscle weakness but notes fatigue. She is on prescription Vit D.  2. Anxiety and depression Kristin Blanchard is on Lexapro and Buspar and she is doing relatively well on meds. Pt denies suicidal or homicidal ideations.  Assessment/Plan:   1. Vitamin D deficiency Low Vitamin D level contributes to fatigue and are associated with obesity, breast, and colon cancer. She agrees to continue to take prescription Vitamin D 50,000 IU every week and will follow-up for routine testing of Vitamin D, at least 2-3 times per year to avoid over-replacement.  Refill- Vitamin D, Ergocalciferol, (DRISDOL) 1.25 MG (50000 UNIT) CAPS capsule; Take 1 capsule (50,000 Units total) by mouth every 7 (seven) days.  Dispense: 4 capsule; Refill: 0  2. Anxiety and depression Behavior modification techniques were discussed today to help Kristin Blanchard deal with her anxiety.  Orders and follow up as documented in patient record. Continue current meds with no refill needed at this time.  3.  Obesity with current BMI of 49.2  Kristin Blanchard is currently in the action stage of change. As such, her goal is to continue with weight loss efforts. She has agreed to keeping a food journal and adhering to recommended goals of 1350 calories and 85+ grams protein.   Exercise goals:  Go to the [facility] for 3 classes per week.  Behavioral modification strategies: increasing lean protein intake, meal planning and cooking strategies, and keeping healthy foods in the home.  Kristin Blanchard has agreed to follow-up with our clinic in 2-3 weeks. She was informed of the importance of frequent follow-up visits to maximize her success with intensive lifestyle modifications for her multiple health conditions.   Objective:   Blood pressure 120/90, pulse 72, temperature 98.1 F (36.7 C), height 5' (1.524 m), weight 252 lb (114.3 kg), last menstrual period 02/12/2021, SpO2 98 %. Body mass index is 49.22 kg/m.  General: Cooperative, alert, well developed, in no acute distress. HEENT: Conjunctivae and lids unremarkable. Cardiovascular: Regular rhythm.  Lungs: Normal work of breathing. Neurologic: No focal deficits.   Lab Results  Component Value Date   CREATININE 0.65 07/25/2020   BUN 10 07/25/2020   NA 142 07/25/2020   K 5.0 07/25/2020   CL 105 07/25/2020   CO2 22 07/25/2020   Lab Results  Component Value Date   ALT 18 07/25/2020   AST 19 07/25/2020   ALKPHOS 92 07/25/2020   BILITOT <0.2 07/25/2020   Lab Results  Component Value Date   HGBA1C 5.5 07/25/2020   HGBA1C 5.2 08/10/2016   HGBA1C  5.1 03/09/2016   HGBA1C 5.2 11/15/2014   Lab Results  Component Value Date   INSULIN 23.1 07/25/2020   Lab Results  Component Value Date   TSH 1.510 07/25/2020   Lab Results  Component Value Date   CHOL 205 (H) 07/25/2020   HDL 71 07/25/2020   LDLCALC 122 (H) 07/25/2020   TRIG 66 07/25/2020   CHOLHDL 2.4 03/09/2016   Lab Results  Component Value Date   VD25OH 17.0 (L) 07/25/2020   VD25OH 14 (L) 05/07/2016    VD25OH 14 (L) 03/09/2016   Lab Results  Component Value Date   WBC 4.0 07/25/2020   HGB 12.6 07/25/2020   HCT 38.9 07/25/2020   MCV 90 07/25/2020   PLT 361 07/25/2020    Attestation Statements:   Reviewed by clinician on day of visit: allergies, medications, problem list, medical history, surgical history, family history, social history, and previous encounter notes.  Edmund Hilda, CMA, am acting as transcriptionist for Reuben Likes, MD.   I have reviewed the above documentation for accuracy and completeness, and I agree with the above. - Reuben Likes, MD

## 2021-04-04 NOTE — Progress Notes (Signed)
Enterer in error

## 2021-04-11 ENCOUNTER — Ambulatory Visit (INDEPENDENT_AMBULATORY_CARE_PROVIDER_SITE_OTHER): Payer: 59 | Admitting: Psychologist

## 2021-04-11 ENCOUNTER — Other Ambulatory Visit: Payer: Self-pay

## 2021-04-11 ENCOUNTER — Ambulatory Visit (HOSPITAL_COMMUNITY)
Admission: EM | Admit: 2021-04-11 | Discharge: 2021-04-11 | Disposition: A | Discharge status: Home / Self Care | Payer: 59

## 2021-04-11 DIAGNOSIS — F332 Major depressive disorder, recurrent severe without psychotic features: Secondary | ICD-10-CM

## 2021-04-11 DIAGNOSIS — F322 Major depressive disorder, single episode, severe without psychotic features: Secondary | ICD-10-CM | POA: Diagnosis not present

## 2021-04-11 NOTE — BH Assessment (Signed)
Kristin Blanchard, Urgent, MR #[id number]; 25 years old presents voluntarily to [facility] and unaccompanied, via [employer].  Pt reports that Dr. Vonita Moss recommended evaluation for SI.  Pt reports that she was driving home on Sunday, felt ' out of control', "I was exceeding the speed limit and driving to fast'.  Pt denied HI or AVH.  Pt admits to prior MH diagnosis or prescribed medication for symptom management,.  MSE signed by patient.

## 2021-04-11 NOTE — BH Assessment (Signed)
Comprehensive Clinical Assessment (CCA) Note  04/11/2021 Kristin Blanchard [id number]  Disposition:  Per Thomes Lolling, NP, patient was in the process of being admitted to the [facility], but patient opted to return home instead.  Patient contracts for safety and mother agrees to monitor.  The patient demonstrates the following risk factors for suicide: Chronic risk factors for suicide include: psychiatric disorder of depression . Acute risk factors for suicide include: family or marital conflict. Protective factors for this patient include: positive social support, positive therapeutic relationship, hope for the future, and religious beliefs against suicide. Considering these factors, the overall suicide risk at this point appears to be low. Patient is appropriate for outpatient follow up.   [place] Office Visit from 07/05/2017 in [place] and [place] for Child and [place] from 05/17/2017 in [place] and [place] for Child and [place] Visit from 03/07/2017 in [place] and [place] for [place] and [place] from 12/31/2016 in [place] and [place] for Child and [place] from 11/29/2016 in [place] and [place] for Child and [place]  Total GAD-7 Score 3 20 7 3 6       PHQ2-9    [place] ED from 04/11/2021 in [facility] Office Visit from 07/25/2020 in [place] Office Visit from 07/05/2017 in [place] and [place] for Child and [place] from 05/17/2017 in [place] and [place] for Child and [place] Visit from 03/07/2017 in [place] and [place] for Child and [place]  PHQ-2 Total Score 4 3 0 6 1  PHQ-9 Total Score 18 12 2 22 7       [place] ED from 04/11/2021 in [place]       Chief Complaint:  Chief Complaint  Patient presents with   Depression   Anxiety   Visit Diagnosis: F33.2 MDD Recurrent Severe    CCA Screening, Triage and Referral (STR)  Patient Reported Information How did you hear about [place]? Other (Comment) (referred by Conception Chancy, Phd, Psycholgist at [facility])  What Is the Reason for Your Visit/Call Today? Patient present to the [facility] with the police voluntarily arriving from her psychologist's office Conception Chancy)  at [place] after her scheduled appointment where she revealed that she was having suicidal thoughts of wrecking her car. Patient states that she has no specific triggers that are making her feel suicidal.  However, on Sunday, she states that she was doing a lot of thinking while she was driving and was on "auto pilot" and states that she found herself driving faster than usual, she was getting closer to other cars and states that it most likely would not have bothered her if she wrecked her car. Patient states that she really does not want to kill herself, but states that she just needs a break.  She states that she has been struggling with depression and anxiety for awhile now.  Patient states that since she graduated from school that thinks have gotten worse and she feels very unsure of herself. Patient states that she has been experiencing panic attacksfor the past two months.  Patient states that she is the middle child and states that she "feels less than normal and overlooked by my family."  Patient states that she has cried for  help, but states that she feels so far away from everyone.  Patient states that she has been shopping and spending her whole paycheck and not saving money.  She states that she is prescribed Lexapro by her PCP, but states that it is not really working. Patient denies HI/Psychosis and SA use.  She states that she is sleeping 5-6 hours per night.  She  states that she is wating less, but she is trying to lose weight.  Patient denies a history of abuse or self-muitlation.  How Long Has This Been Causing You Problems? 1-6 months  What Do You Feel Would Help You the Most Today? Treatment for Depression or other mood problem   Have You Recently Had Any Thoughts About Hurting Yourself? Yes  Are You Planning to Commit Suicide/Harm Yourself At This time? No   Have you Recently Had Thoughts About [place]? No  Are You Planning to Harm Someone at This Time? No  Explanation: No data recorded  Have You Used Any Alcohol or Drugs in the Past 24 Hours? No  How Long Ago Did You Use Drugs or Alcohol? No data recorded What Did You Use and How Much? No data recorded  Do You Currently Have a Therapist/Psychiatrist? No data recorded Name of Therapist/Psychiatrist: No data recorded  Have You Been Recently Discharged From Any Office Practice or Programs? No data recorded Explanation of Discharge From Practice/Program: No data recorded    CCA Screening Triage Referral Assessment Type of Contact: No data recorded Telemedicine Service Delivery:   Is this Initial or Reassessment? No data recorded Date Telepsych consult ordered in [facility]:  No data recorded Time Telepsych consult ordered in [facility]:  No data recorded Location of Assessment: No data recorded Provider Location: No data recorded  Collateral Involvement: No data recorded  Does Patient Have a Silas? No data recorded Name and Contact of Legal Guardian: No data recorded If Minor and Not Living with Parent(s), Who has Custody? No data recorded Is CPS involved or ever been involved? No data recorded Is APS involved or ever been involved? No data recorded  Patient Determined To Be At Risk for Harm To Self or Others Based on Review of Patient Reported Information or Presenting Complaint? No data recorded Method: No data recorded Availability of Means: No  data recorded Intent: No data recorded Notification Required: No data recorded Additional Information for Danger to Others Potential: No data recorded Additional Comments for Danger to Others Potential: No data recorded Are There Guns or Other Weapons in Your Home? No data recorded Types of Guns/Weapons: No data recorded Are These Weapons Safely Secured?                            No data recorded Who Could Verify You Are Able To Have These Secured: No data recorded Do You Have any Outstanding Charges, Pending Court Dates, Parole/Probation? No data recorded Contacted To Inform of Risk of Harm To Self or Others: No data recorded   Does Patient Present under Involuntary Commitment? 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  Symptoms Depression:   Change in energy/activity; Difficulty Concentrating; Hopelessness; Sleep (too much or little); Tearfulness; Worthlessness   Duration of Depressive symptoms:  Duration of Depressive Symptoms: Greater than two weeks   Mania:   None   Anxiety:    Difficulty concentrating; Restlessness; Sleep; Worrying; Tension   Psychosis:   None   Duration of Psychotic symptoms:    Trauma:   None   Obsessions:   None   Compulsions:   None   Inattention:   None   Hyperactivity/Impulsivity:   None   Oppositional/Defiant Behaviors:   None   Emotional Irregularity:   Potentially harmful impulsivity; Unstable self-image   Other Mood/Personality Symptoms:   depressed mood, tearful    Mental Status Exam Appearance and self-care   Stature:   Average   Weight:   Overweight   Clothing:   Casual   Grooming:   Normal   Cosmetic use:   Age appropriate   Posture/gait:   Normal   Motor activity:   Not Remarkable   Sensorium  Attention:   Normal   Concentration:   Anxiety interferes   Orientation:   Object; Person; Place; Situation; Time   Recall/memory:   Normal   Affect and Mood  Affect:   Depressed; Anxious   Mood:   Depressed; Anxious; Hopeless   Relating  Eye contact:   Normal   Facial expression:   Depressed; Anxious   Attitude toward examiner:   Cooperative   Thought and Language  Speech flow:  Clear and Coherent   Thought content:   Appropriate to Mood and Circumstances   Preoccupation:   None   Hallucinations:   None   Organization:  No data recorded  [employer] of Knowledge:   Good   Intelligence:   Above Average   Abstraction:   Normal   Judgement:   Good   Reality Testing:   Realistic   Insight:   Lacking   Decision Making:   Impulsive   Social Functioning  Social Maturity:   Responsible   Social Judgement:   Normal   Stress  Stressors:   Family conflict; Transitions   Coping Ability:   Overwhelmed; Exhausted   Skill Deficits:   Decision making   Supports:   Family     Religion: Religion/Spirituality Are You A Religious Person?:  (not assessed) How Might This Affect Treatment?: N/A  Leisure/Recreation: Leisure / Recreation Do You Have Hobbies?: No  Exercise/Diet: Exercise/Diet Have You Gained or Lost A Significant Amount of Weight in the Past Six Months?: Yes-Lost Number of Pounds Lost?:  (amount unknown, patient is dieting) Do You Follow a Special Diet?: Yes Type of Diet: weight loss Do You Have Any Trouble Sleeping?: Yes Explanation of Sleeping Difficulties: sleeps 5-6 hours, sleep varies   CCA Employment/Education Employment/Work Situation: Employment / Work Situation Employment Situation:  Employed Work Stressors: none reported Patient's Job has Been Impacted by Current Illness: No Has Patient ever Been in Equities trader?: No  Education: Education Is Patient Currently Attending School?: No Last Grade Completed: 12 Did You Product manager?: Yes What Type of College Degree Do you Have?: Bachelors Did You Have Any Difficulty At [employer]?: No Patient's Education Has Been Impacted by Current Illness: No   CCA Family/Childhood History Family and Relationship History: Family history Marital status: Single Does patient have children?: No  Childhood History:  Childhood History By whom was/is the patient raised?: Both parents Did patient suffer any verbal/emotional/physical/sexual abuse as a child?: No  Did patient suffer from severe childhood neglect?: No Has patient ever been sexually abused/assaulted/raped as an adolescent or adult?: No Was the patient ever a victim of a crime or a disaster?: No Witnessed domestic violence?: No Has patient been affected by domestic violence as an adult?: No  Child/Adolescent Assessment:     CCA Substance Use Alcohol/Drug Use: Alcohol / Drug Use Pain Medications: see MAR Prescriptions: see MAR Over the Counter: see MAR History of alcohol / drug use?: No history of alcohol / drug abuse Longest period of sobriety (when/how long): NA                         ASAM's:  Six Dimensions of Multidimensional Assessment  Dimension 1:  Acute Intoxication and/or Withdrawal Potential:      Dimension 2:  Biomedical Conditions and Complications:      Dimension 3:  Emotional, Behavioral, or Cognitive Conditions and Complications:     Dimension 4:  Readiness to Change:     Dimension 5:  Relapse, Continued use, or Continued Problem Potential:     Dimension 6:  Recovery/Living Environment:     ASAM Severity Score:    ASAM Recommended Level of Treatment:     Substance use Disorder (SUD)    Recommendations for  Services/Supports/Treatments:    Discharge Disposition:    DSM5 Diagnoses: Patient Active Problem List   Diagnosis Date Noted   Severe episode of recurrent major depressive disorder, without psychotic features ([place])    Adjustment disorder with mixed anxiety and depressed mood 05/31/2017   Patellar pain, right 12/10/2016   Irregular menses 05/07/2016   Nonintractable headache 05/07/2016   Vitamin D deficiency 08/24/2015   Obesity 10/22/2014   Asthma, chronic 02/09/2014   Environmental allergies 02/09/2014     Referrals to Alternative Service(s): Referred to Alternative Service(s):   Place:   Date:   Time:    Referred to Alternative Service(s):   Place:   Date:   Time:    Referred to Alternative Service(s):   Place:   Date:   Time:    Referred to Alternative Service(s):   Place:   Date:   Time:     Chakia Counts J [place] Warriner, LCAS

## 2021-04-11 NOTE — ED Provider Notes (Signed)
Behavioral Health Urgent Care Medical Screening Exam  Patient Name: Kristin Blanchard MRN: [id number] Date of Evaluation: 04/11/21 Chief Complaint:   Diagnosis:  Final diagnoses:  MDD (major depressive disorder), recurrent severe, without psychosis ([place])    History of Present illness: Kristin Blanchard is a 25 y.o. female patient presented to [facility] as a walk in accompanied by [employer] voluntarily.  Patient was at her psychologist office [facility]) for an appointment.  She expressed to the provider she had previously had suicidal thoughts of wrecking her car.  Provider had patient escorted to [facility] via [employer] for psychiatric assessment.   Kristin Blanchard, 25 y.o., female patient seen face to face by this provider, consulted with Dr. Serafina Mitchell; and chart reviewed on 04/12/21.  On evaluation Kristin Blanchard reports she did have some vague suicidal thoughts while she was driving her car this past Sunday.  States she felt like she was on "autopilot".  She found herself driving too fast in getting too close to cars.  Reports when she is driving she does "a lot of thinking".  She began having negative thoughts of how "I am not happy, I am not where I want to be at this age I feel less than average".  Patient continues to express how she feels subpar.  She graduated in August and is dissatisfied that she is living with her mother father and 5 siblings.  She has a part-time job as a Geologist, engineering for [employer].  She is happy with her position but is unable to save money.  During evaluation Kristin Blanchard is in sitting position in no acute distress.  She is tearful at times.  She makes good eye contact.  She is speaking in a clear tone at moderate volume, normal pace, and normal tone.  She endorses increased anxiety and depression since August.  Reports decreased appetite, feelings of helplessness, self isolating, and unable to perform daily activities such as cleaning.  States, "the only thing I am doing  right is keeping up with myself hygiene". Reports too little or too much sleep depending on her work schedule.  Reports she is worried that she is a burden to her family.  Her brother was recently admitted into an inpatient psychiatric facility for bipolar disorder.She denies racing thoughts. Reports she does spend money but does not spend outside of her paycheck. She does not appear to be currently responding to internal/external stimuli or experiencing delusional thought content.  She denies auditory and visual hallucinations.  Endorses passive suicidal ideations.  She expresses no intent or plan.  She denies access to firearms/weapons.  Patient contracts for safety, states "I do not want to die I would not do that to my family".  States "I just need some help"..  Discussed PHP/IOP with patient and admission into facility base crisis unit Patient ask many questions about [facility] and outpatient services.  Both were discussed extensively with patient, patient was indecisive.  Patient wanted to speak with her mother, she was allowed to use the telephone.  Discussed with patient that she would be allowed to keep her head scarf if she were to be admitted due to her Muslim beliefs.  Explained her private room would be on a hallway with only women.  Patient declined [place].  She has agreed to participate in PHP/IOP.  Referral was made to [facility].  Collateral information: Patient's mother.  States she has no immediate safety concerns for patient returning home.  States there are no firearms/weapons  in the home.  With patient's permission [facility] and IOP/PHP was explained.  Mother states she will keep an eye on the patient and if her condition worsens she would return to the [facility].  At this time Kristin Blanchard is educated and verbalizes understanding of mental health resources and other crisis services in the community.  call 911 and present to the nearest emergency room should  she experience any suicidal/homicidal  ideation, auditory/visual/hallucinations, or detrimental worsening of her mental health condition. She was a also advised by Probation officer that she could call the toll-free phone on insurance card to assist with identifying in network counselors and agencies or number on back of insurance card to speak with care coordinator.    Psychiatric Specialty Exam  Presentation  General Appearance:Casual  Eye Contact:Good  Speech:Clear and Coherent; Normal Rate  Speech Volume:Normal  Handedness:Right   Mood and Affect  Mood:Anxious; Depressed  Affect:Tearful; Depressed; Congruent   Thought Process  Thought Processes:Coherent  Descriptions of Associations:Intact  Orientation:Full (Time, Place and Person)  Thought Content:Logical  Diagnosis of Schizophrenia or Schizoaffective disorder in past: No   Hallucinations:None  Ideas of Reference:None  Suicidal Thoughts:No  Homicidal Thoughts:No   Sensorium  Memory:Immediate Good; Recent Good; Remote Good  Judgment:Poor  Insight:Poor   Executive Functions  Concentration:Good  Attention Span:Good  [place]  Language:Good   Psychomotor Activity  Psychomotor Activity:Normal   Assets  Assets:Communication Skills; Desire for Improvement; Financial Resources/Insurance; Housing; Leisure Time; Physical Health; Resilience; Social Support; Vocational/Educational   Sleep  Sleep:Poor  Number of hours: No data recorded  Nutritional Assessment (For OBS and FBC admissions only) Has the patient had a weight loss or gain of 10 pounds or more in the last 3 months?: No Has the patient had a decrease in food intake/or appetite?: Yes Does the patient have dental problems?: No Does the patient have eating habits or behaviors that may be indicators of an eating disorder including binging or inducing vomiting?: No Has the patient recently lost weight without trying?: 0 Has the patient been eating poorly because  of a decreased appetite?: 1 Malnutrition Screening Tool Score: 1    Physical Exam: Physical Exam Vitals and nursing note reviewed.  Constitutional:      General: She is not in acute distress.    Appearance: Normal appearance. She is not ill-appearing.  HENT:     Head: Normocephalic.  Eyes:     General:        Right eye: No discharge.        Left eye: No discharge.     Conjunctiva/sclera: Conjunctivae normal.  Cardiovascular:     Rate and Rhythm: Normal rate.  Pulmonary:     Effort: Pulmonary effort is normal.  Musculoskeletal:        General: Normal range of motion.     Cervical back: Normal range of motion.  Skin:    General: Skin is warm.     Coloration: Skin is not jaundiced or pale.  Neurological:     Mental Status: She is alert and oriented to person, place, and time.  Psychiatric:        Attention and Perception: Attention and perception normal.        Mood and Affect: Mood is anxious and depressed. Affect is tearful.        Speech: Speech normal.        Behavior: Behavior is cooperative.        Thought Content: Thought content includes suicidal  ideation. Thought content does not include suicidal plan.        Judgment: Judgment is impulsive.  Review of Systems  Constitutional: Negative.   HENT: Negative.    Eyes: Negative.   Respiratory: Negative.    Cardiovascular: Negative.   Musculoskeletal: Negative.   Skin: Negative.   Neurological: Negative.   Blood pressure 105/87, pulse 66, temperature 98 F (36.7 C), temperature source Oral, resp. rate 16, SpO2 100 %. There is no height or weight on file to calculate BMI.  Musculoskeletal: Strength & Muscle Tone: within normal limits Gait & Station: normal Patient leans: N/A   [facility] MSE Discharge Disposition for Follow up and Recommendations: Based on my evaluation the patient does not appear to have an emergency medical condition and can be discharged with resources and follow up care in outpatient services for  Medication Management and Partial Hospitalization Program  Discharge patient  Outpatient psychiatric resources provided.  Patient was referred to behavioral health IOP/PHP on second floor.  Whitney Cobia notified.  Discussed if symptoms worsen to return to the [facility], call 911, or present to the nearest emergency room.  No evidence of imminent risk to self or others at present.    Patient does not meet criteria for psychiatric inpatient admission. Discussed crisis plan, support from social network, calling 911, coming to the Emergency Department, and calling Suicide Hotline.  Ardis Hughs, NP 04/11/2021, 2:47 PM

## 2021-04-11 NOTE — Discharge Summary (Signed)
Kristin Blanchard to be D/C'd Home per NP order.  An After Visit Summary was printed and given to the patient by provider. Patient escorted out and D/C home via private auto.  [name]  04/11/2021 2:35 PM

## 2021-04-11 NOTE — Discharge Instructions (Signed)

## 2021-04-11 NOTE — Progress Notes (Signed)
Kristin Blanchard received her AVS, questions answered and was discharged without incident.

## 2021-04-12 ENCOUNTER — Ambulatory Visit (INDEPENDENT_AMBULATORY_CARE_PROVIDER_SITE_OTHER): Payer: 59 | Admitting: Family Medicine

## 2021-04-12 ENCOUNTER — Encounter (INDEPENDENT_AMBULATORY_CARE_PROVIDER_SITE_OTHER): Payer: Self-pay | Admitting: Family Medicine

## 2021-04-12 ENCOUNTER — Telehealth (HOSPITAL_COMMUNITY): Payer: Self-pay | Admitting: Professional

## 2021-04-12 ENCOUNTER — Telehealth (HOSPITAL_COMMUNITY): Payer: Self-pay | Admitting: Emergency Medicine

## 2021-04-12 VITALS — HR 84 | Temp 97.6°F | Ht 60.0 in | Wt 255.0 lb

## 2021-04-12 DIAGNOSIS — E559 Vitamin D deficiency, unspecified: Secondary | ICD-10-CM | POA: Diagnosis not present

## 2021-04-12 DIAGNOSIS — Z6841 Body Mass Index (BMI) 40.0 and over, adult: Secondary | ICD-10-CM

## 2021-04-12 DIAGNOSIS — E8881 Metabolic syndrome: Secondary | ICD-10-CM

## 2021-04-12 DIAGNOSIS — F419 Anxiety disorder, unspecified: Secondary | ICD-10-CM | POA: Diagnosis not present

## 2021-04-12 DIAGNOSIS — F32A Depression, unspecified: Secondary | ICD-10-CM

## 2021-04-12 NOTE — BH Assessment (Signed)
Care Management - Follow Up [facility] Discharges   Writer made contact with the patient. Patient reports that she is waiting to hear from someone in the Partial Hospitalization Program to contact her to set up an appointment.   Writer sent an email to the therapist working in the Partial Hospitalization Program to assist with follow up for the patient.

## 2021-04-13 NOTE — Progress Notes (Signed)
Chief Complaint:   OBESITY Kristin Blanchard is here to discuss her progress with her obesity treatment plan along with follow-up of her obesity related diagnoses. Kristin Blanchard is on keeping a food journal and adhering to recommended goals of 1350 calories and 85+ grams protein and states she is following her eating plan approximately 50% of the time. Elmarie states she is not currently exercising.  Today's visit was #: 12 Starting weight: 244 lbs Starting date: 07/25/2020 Today's weight: 255 lbs Today's date: 04/12/2021 Total lbs lost to date: 0 Total lbs lost since last in-office visit: 0  Interim History: Kristin Blanchard voices she was escorted to [facility] yesterday for suicidal thoughts. She feels unempowered and also overwhelmed. She did notice mindfulness and journaling about 50% of the time. She is noticing a decline in effort in journaling. Pt recognizes that she sometimes skips eating due to lack of motivation.   Subjective:   1. Vitamin D deficiency Pt denies nausea, vomiting, and muscle weakness but notes fatigue. Her last Vit D level was 17.0.  2. Insulin resistance Kristin Blanchard's A1c was 5.5 and insulin level 23.1. She is not on medication.  3. Anxiety and depression Kristin Blanchard denies homicidal ideations but does have occasional suicidal ideations, but none at this time. She is on Lexapro, Vistaril, and Buspar.  Assessment/Plan:   1. Vitamin D deficiency Low Vitamin D level contributes to fatigue and are associated with obesity, breast, and colon cancer. She agrees to continue to take prescription Vitamin D 50,000 IU every week and will follow-up for routine testing of Vitamin D, at least 2-3 times per year to avoid over-replacement. Check labs at next appt.  2. Insulin resistance Kristin Blanchard will continue to work on weight loss, exercise, and decreasing simple carbohydrates to help decrease the risk of diabetes. Kristin Blanchard agreed to follow-up with [place] as directed to closely monitor her progress. Continue  journaling.  3. Anxiety and depression Behavior modification techniques were discussed today to help Kristin Blanchard deal with her anxiety.  Orders and follow up as documented in patient record. Follow up with outpatient therapy.  4. Obesity with current BMI of 49.9  Kristin Blanchard is currently in the action stage of change. As such, her goal is to continue with weight loss efforts. She has agreed to keeping a food journal and adhering to recommended goals of 1350 calories and 85+ grams protein.   Exercise goals: No exercise has been prescribed at this time.  Behavioral modification strategies: increasing lean protein intake, meal planning and cooking strategies, and keeping healthy foods in the home.  Kristin Blanchard has agreed to follow-up with our clinic in 3-4 weeks. She was informed of the importance of frequent follow-up visits to maximize her success with intensive lifestyle modifications for her multiple health conditions.   Objective:   Pulse 84, temperature 97.6 F (36.4 C), height 5' (1.524 m), weight 255 lb (115.7 kg), last menstrual period 03/30/2021, SpO2 98 %. Body mass index is 49.8 kg/m.  General: Cooperative, alert, well developed, in no acute distress. HEENT: Conjunctivae and lids unremarkable. Cardiovascular: Regular rhythm.  Lungs: Normal work of breathing. Neurologic: No focal deficits.   Lab Results  Component Value Date   CREATININE 0.65 07/25/2020   BUN 10 07/25/2020   NA 142 07/25/2020   K 5.0 07/25/2020   CL 105 07/25/2020   CO2 22 07/25/2020   Lab Results  Component Value Date   ALT 18 07/25/2020   AST 19 07/25/2020   ALKPHOS 92 07/25/2020   BILITOT <0.2  07/25/2020   Lab Results  Component Value Date   HGBA1C 5.5 07/25/2020   HGBA1C 5.2 08/10/2016   HGBA1C 5.1 03/09/2016   HGBA1C 5.2 11/15/2014   Lab Results  Component Value Date   INSULIN 23.1 07/25/2020   Lab Results  Component Value Date   TSH 1.510 07/25/2020   Lab Results  Component Value Date   CHOL 205  (H) 07/25/2020   HDL 71 07/25/2020   LDLCALC 122 (H) 07/25/2020   TRIG 66 07/25/2020   CHOLHDL 2.4 03/09/2016   Lab Results  Component Value Date   VD25OH 17.0 (L) 07/25/2020   VD25OH 14 (L) 05/07/2016   VD25OH 14 (L) 03/09/2016   Lab Results  Component Value Date   WBC 4.0 07/25/2020   HGB 12.6 07/25/2020   HCT 38.9 07/25/2020   MCV 90 07/25/2020   PLT 361 07/25/2020   Attestation Statements:   Reviewed by clinician on day of visit: allergies, medications, problem list, medical history, surgical history, family history, social history, and previous encounter notes.  Edmund Hilda, CMA, am acting as transcriptionist for Reuben Likes, MD.  I have reviewed the above documentation for accuracy and completeness, and I agree with the above. - Reuben Likes, MD

## 2021-04-18 ENCOUNTER — Telehealth (INDEPENDENT_AMBULATORY_CARE_PROVIDER_SITE_OTHER): Payer: Self-pay | Admitting: Psychology

## 2021-04-18 ENCOUNTER — Other Ambulatory Visit (HOSPITAL_COMMUNITY): Payer: 59 | Attending: Psychiatry | Admitting: Professional

## 2021-04-18 ENCOUNTER — Encounter (INDEPENDENT_AMBULATORY_CARE_PROVIDER_SITE_OTHER): Payer: Self-pay

## 2021-04-18 ENCOUNTER — Encounter (INDEPENDENT_AMBULATORY_CARE_PROVIDER_SITE_OTHER): Payer: 59 | Admitting: Psychology

## 2021-04-18 ENCOUNTER — Other Ambulatory Visit: Payer: Self-pay

## 2021-04-18 DIAGNOSIS — F419 Anxiety disorder, unspecified: Secondary | ICD-10-CM | POA: Insufficient documentation

## 2021-04-18 DIAGNOSIS — F332 Major depressive disorder, recurrent severe without psychotic features: Secondary | ICD-10-CM | POA: Insufficient documentation

## 2021-04-18 DIAGNOSIS — R41844 Frontal lobe and executive function deficit: Secondary | ICD-10-CM | POA: Insufficient documentation

## 2021-04-18 DIAGNOSIS — R4589 Other symptoms and signs involving emotional state: Secondary | ICD-10-CM | POA: Insufficient documentation

## 2021-04-18 NOTE — Addendum Note (Signed)
Addended by: Cristy Folks on: 04/18/2021 02:48 PM   Modules accepted: Level of Service, SmartSet

## 2021-04-18 NOTE — Telephone Encounter (Deleted)
Entered in error

## 2021-04-18 NOTE — Telephone Encounter (Addendum)
  Office: [phone]  /  Fax: [phone]  Date of Encounter: April 18, 2021  Time of Encounter: 2:30pm Duration of Encounter: 5 minutes Provider: Lawerance Cruel, PsyD  CONTENT: Shyann presented on time for today's follow-up appointment via MyChart Video Visit. Chart review revealed she presented for an appointment to initiate the PHP program with [facility]. As such, she was receptive to rescheduling today's appointment to avoid any issues with her insurance. Kaiden explained she will be starting the [facility] program tomorrow and it ends on May 12, 2021. She further indicated her next appointment with Dr. Hilbert Corrigan (primary therapist) is on May 02, 2021, adding she will not be attending the [facility] program that day. A brief risk assessment was completed. Amberlie denied experiencing suicidal and homicidal ideation, plan, or intent since her recent visit to [facility]. She indicated she continues to have access to emergency resources and expressed confidence in utilizing resources. All questions/concerns addressed.   PLAN: [place] is scheduled for an appointment on May 15, 2021 at 2:00pm. The no show fee for today will be waived.

## 2021-04-18 NOTE — Telephone Encounter (Signed)
Entered in error

## 2021-04-18 NOTE — Psych (Signed)
Virtual Visit via Video Note  I connected with Geraldeen Minasyan on 04/18/21 at 11:30 AM EST by a video enabled telemedicine application and verified that I am speaking with the correct person using two identifiers.  Location: Patient: Home Provider: Clinical Home Office   I discussed the limitations of evaluation and management by telemedicine and the availability of in person appointments. The patient expressed understanding and agreed to proceed.  Follow Up Instructions:    I discussed the assessment and treatment plan with the patient. The patient was provided an opportunity to ask questions and all were answered. The patient agreed with the plan and demonstrated an understanding of the instructions.   The patient was advised to call back or seek an in-person evaluation if the symptoms worsen or if the condition fails to improve as anticipated.  I provided 60 minutes of non-face-to-face time during this encounter.   Royetta Crochet, [facility]     Comprehensive Clinical Assessment (CCA) Note  04/18/2021 Adilene Roque [id number]  Chief Complaint:  Chief Complaint  Patient presents with   Depression   Anxiety   Visit Diagnosis: MDD    CCA Screening, Triage and Referral (STR)  Patient Reported Information How did you hear about [place]? Other (Comment)  Referral name: [facility]  Referral phone number: No data recorded  Whom do you see for routine medical problems? Other (Comment) ([facility]- Dr. Jearld Shines)  Practice/Facility Name: No data recorded Practice/Facility Phone Number: No data recorded Name of Contact: No data recorded Contact Number: No data recorded Contact Fax Number: No data recorded Prescriber Name: No data recorded Prescriber Address (if known): No data recorded  What Is the Reason for Your Visit/Call Today? follow up depression; anxiety; SI  How Long Has This Been Causing You Problems? > than 6 months  What Do You Feel Would Help You  the Most Today? Treatment for Depression or other mood problem   Have You Recently Been in Any Inpatient Treatment (Hospital/Detox/Crisis Center/28-Day Program)? No  Name/Location of Program/Hospital:No data recorded How Long Were You There? No data recorded When Were You Discharged? No data recorded  Have You Ever Received Services From [facility] Before? Yes  Who Do You See at [facility]? [place]; healthy weight and wellness clinic   Have You Recently Had Any Thoughts About Hurting Yourself? Yes  Are You Planning to Commit Suicide/Harm Yourself At This time? No   Have you Recently Had Thoughts About [place]? No  Explanation: No data recorded  Have You Used Any Alcohol or Drugs in the Past 24 Hours? No  How Long Ago Did You Use Drugs or Alcohol? No data recorded What Did You Use and How Much? No data recorded  Do You Currently Have a Therapist/Psychiatrist? Yes  Name of Therapist/Psychiatrist: Therapist: Conception Chancy- sent her to [facility]   Have You Been Recently Discharged From Any Office Practice or Programs? No  Explanation of Discharge From Practice/Program: No data recorded    CCA Screening Triage Referral Assessment Type of Contact: Tele-Assessment  Is this Initial or Reassessment? Initial Assessment  Date Telepsych consult ordered in [facility]:  No data recorded Time Telepsych consult ordered in [facility]:  No data recorded  Patient Reported Information Reviewed? No data recorded Patient Left Without Being Seen? No data recorded Reason for Not Completing Assessment: No data recorded  Collateral Involvement: notes   Does Patient Have a [place]? No data recorded Name and Contact of Legal Guardian: No data  recorded If Minor and Not Living with Parent(s), Who has Custody? No data recorded Is CPS involved or ever been involved? Never  Is APS involved or ever been involved? Never   Patient Determined To Be At Risk for Harm To Self  or Others Based on Review of Patient Reported Information or Presenting Complaint? No data recorded Method: No data recorded Availability of Means: No data recorded Intent: No data recorded Notification Required: No data recorded Additional Information for Danger to Others Potential: No data recorded Additional Comments for Danger to Others Potential: No data recorded Are There Guns or Other Weapons in Your Home? No data recorded Types of Guns/Weapons: No data recorded Are These Weapons Safely Secured?                            No data recorded Who Could Verify You Are Able To Have These Secured: No data recorded Do You Have any Outstanding Charges, Pending Court Dates, Parole/Probation? No data recorded Contacted To Inform of Risk of Harm To Self or Others: No data recorded  Location of Assessment: Other (comment)   Does Patient Present under Involuntary Commitment? No  IVC Papers Initial File Date: No data recorded  [place] of Residence: [place]   Patient Currently Receiving the Following Services: Individual Therapy   Determination of Need: Urgent (48 hours)   Options For Referral: Partial Hospitalization     CCA Biopsychosocial Intake/Chief Complaint:  Pt reports from [facility] for SI. Pt reports stressors: 1) Future: Pt reports she just graduated from college and "the fear of the unknown is a big stressor." 2) Financial: Pt reports she has a good part-time job but doesn't make enough to survive on her own. 3) MH: Pt reports "I don't know where this is coming from. I have had a good life." Pt reports she has moved home with family since August. Pt reports treatment history as seeing therapist and psychiatrist at [facility]. Pt reports supports include family, friends. 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, motivation for treatment  Preferences: to feel better  Abilities: can attend and participate in treatment   Type of Services Patient Feels are Needed: PHP   Initial Clinical Notes/Concerns: No data recorded  Mental Health Symptoms Depression:   Change in energy/activity; Difficulty Concentrating; Hopelessness; Sleep (too much or little); Tearfulness; Worthlessness; Irritability; Fatigue; Increase/decrease in appetite; Weight gain/loss   Duration of Depressive symptoms:  Greater than two weeks   Mania:   None   Anxiety:    Difficulty concentrating; Restlessness; Sleep; Worrying; Tension; Irritability   Psychosis:   None   Duration of Psychotic symptoms: No data recorded  Trauma:   None   Obsessions:   None   Compulsions:   None   Inattention:   None   Hyperactivity/Impulsivity:   None   Oppositional/Defiant Behaviors:   None   Emotional Irregularity:   Potentially harmful impulsivity; Unstable self-image   Other Mood/Personality Symptoms:   depressed mood, tearful    Mental Status Exam Appearance and self-care  Stature:   Average   Weight:   Overweight   Clothing:   Casual  Grooming:   Normal   Cosmetic use:   Age appropriate   Posture/gait:   Normal   Motor activity:   Not Remarkable   Sensorium  Attention:   Normal   Concentration:   Anxiety interferes   Orientation:   Object; Person; Place; Situation; Time   Recall/memory:   Normal   Affect and Mood  Affect:   Depressed; Anxious   Mood:   Depressed; Anxious   Relating  Eye contact:   Normal   Facial expression:   Depressed; Anxious   Attitude toward examiner:   Cooperative   Thought and Language  Speech flow:  Clear and Coherent   Thought content:   Appropriate to Mood and  Circumstances   Preoccupation:   None   Hallucinations:   None   Organization:  No data recorded  [employer] of Knowledge:   Good   Intelligence:   Above Average   Abstraction:   Normal   Judgement:   Fair   Reality Testing:   Realistic   Insight:   Lacking   Decision Making:   Impulsive; Paralyzed; Vacilates   Social Functioning  Social Maturity:   Responsible   Social Judgement:   Normal   Stress  Stressors:   Family conflict; Transitions; Financial   Coping Ability:   Overwhelmed; Exhausted   Skill Deficits:   Decision making   Supports:   Family; Friends/Service system     Religion: Religion/Spirituality Are You A Religious Person?: Yes (not assessed) What is Your Religious Affiliation?: Muslim How Might This Affect Treatment?: N/A  Leisure/Recreation: Leisure / Recreation Do You Have Hobbies?: No  Exercise/Diet: Exercise/Diet Do You Exercise?: No Have You Gained or Lost A Significant Amount of Weight in the Past Six Months?: Yes-Gained Number of Pounds Gained: 15 Number of Pounds Lost?:  (amount unknown, patient is dieting) Do You Follow a Special Diet?: Yes Type of Diet: weight loss Do You Have Any Trouble Sleeping?: Yes Explanation of Sleeping Difficulties: sleeps 5-6 hours, sleep varies   CCA Employment/Education Employment/Work Situation: Employment / Work Situation Employment Situation: Employed 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 has Patient Been Employed?: Mid-September Are You Satisfied With Your Job?: Yes Do You Work More Than One Job?: No Work Stressors: none reported Patient's Job has Been Impacted by Current Illness: No Has Patient ever Been in the [employer]?: No  Education: Education Last Grade Completed: 12 Did You Nutritional therapist?: Yes What Type of College Degree Do you Have?: Re[employer] Did Financial controller?: No Did You Have An Individualized Education Program (IIEP): No Did You Have Any Difficulty At [employer]?: No Patient's Education Has Been Impacted by Current Illness: No   CCA Family/Childhood History Family and Relationship History: Family history Marital status: Single Are you sexually active?: No What is your sexual orientation?: heterosexual Does patient have children?: No  Childhood History:  Childhood History By whom was/is the patient raised?: Both parents Additional childhood history information: Pt reports parents imigrated to [place]. Pt reports Dad owns a small business and remembers being poor at times. Pt reports parents love her "a lot." Pt reports "I think of funny memories. I had a good life." Description of patient's relationship with caregiver when they were a child: Good with both Patient's description of current relationship with people who raised him/her: Good with both Does patient have siblings?: Yes Number of Siblings: 4 Description of patient's  current relationship with siblings: Middle child of 5- good relationship with all Did patient suffer any verbal/emotional/physical/sexual abuse as a child?: No Did patient suffer from severe childhood neglect?: No Has patient ever been sexually abused/assaulted/raped as an adolescent or adult?: No Was the patient ever a victim of a crime or a disaster?: No Witnessed domestic violence?: No Has patient been affected by domestic violence as an adult?: No  Child/Adolescent Assessment:     CCA Substance Use Alcohol/Drug Use: Alcohol / Drug Use Pain Medications: see MAR Prescriptions: see MAR Over the Counter: see MAR History of alcohol / drug use?: No history of alcohol / drug abuse Longest period of sobriety (when/how long): NA                         ASAM's:  Six Dimensions of Multidimensional Assessment  Dimension 1:  Acute Intoxication and/or Withdrawal Potential:       Dimension 2:  Biomedical Conditions and Complications:      Dimension 3:  Emotional, Behavioral, or Cognitive Conditions and Complications:     Dimension 4:  Readiness to Change:     Dimension 5:  Relapse, Continued use, or Continued Problem Potential:     Dimension 6:  Recovery/Living Environment:     ASAM Severity Score:    ASAM Recommended Level of Treatment:     Substance use Disorder (SUD)    Recommendations for Services/Supports/Treatments: Recommendations for Services/Supports/Treatments Recommendations For Services/Supports/Treatments: Partial Hospitalization  DSM5 Diagnoses: Patient Active Problem List   Diagnosis Date Noted   Severe episode of recurrent major depressive disorder, without psychotic features (HCC)    Adjustment disorder with mixed anxiety and depressed mood 05/31/2017   Patellar pain, right 12/10/2016   Irregular menses 05/07/2016   Nonintractable headache 05/07/2016   Vitamin D deficiency 08/24/2015   Obesity 10/22/2014   Asthma, chronic 02/09/2014   Environmental allergies 02/09/2014    Patient Centered Plan: Patient is on the following Treatment Plan(s):  Depression   Referrals to Alternative Service(s): Referred to Alternative Service(s):   Place:   Date:   Time:    Referred to Alternative Service(s):   Place:   Date:   Time:    Referred to Alternative Service(s):   Place:   Date:   Time:    Referred to Alternative Service(s):   Place:   Date:   Time:     Quinn Axe, [facility]

## 2021-04-19 ENCOUNTER — Encounter (HOSPITAL_COMMUNITY): Payer: Self-pay

## 2021-04-19 ENCOUNTER — Other Ambulatory Visit (HOSPITAL_COMMUNITY): Payer: 59 | Admitting: Occupational Therapy

## 2021-04-19 ENCOUNTER — Other Ambulatory Visit: Payer: Self-pay

## 2021-04-19 ENCOUNTER — Encounter (HOSPITAL_COMMUNITY): Payer: Self-pay | Admitting: Family

## 2021-04-19 ENCOUNTER — Other Ambulatory Visit (HOSPITAL_COMMUNITY): Payer: 59 | Admitting: Licensed Clinical Social Worker

## 2021-04-19 DIAGNOSIS — F331 Major depressive disorder, recurrent, moderate: Secondary | ICD-10-CM

## 2021-04-19 DIAGNOSIS — R41844 Frontal lobe and executive function deficit: Secondary | ICD-10-CM

## 2021-04-19 DIAGNOSIS — F332 Major depressive disorder, recurrent severe without psychotic features: Secondary | ICD-10-CM | POA: Diagnosis not present

## 2021-04-19 DIAGNOSIS — R4589 Other symptoms and signs involving emotional state: Secondary | ICD-10-CM

## 2021-04-19 DIAGNOSIS — F419 Anxiety disorder, unspecified: Secondary | ICD-10-CM | POA: Diagnosis not present

## 2021-04-19 MED ORDER — ARIPIPRAZOLE 5 MG PO TABS: 5.0000 mg | ORAL_TABLET | Freq: Every day | ORAL | 0 refills | Status: DC

## 2021-04-19 NOTE — Therapy (Signed)
[facility] PARTIAL HOSPITALIZATION PROGRAM [address] [place], [place], [address] Phone: [phone]   Fax:  [phone] Virtual Visit via Video Note  I connected with Kristin Blanchard on 04/19/21 at  12:00 PM EST by a video enabled telemedicine application and verified that I am speaking with the correct person using two identifiers.  Location: Patient: Patient Home Provider: Home Office   I discussed the limitations of evaluation and management by telemedicine and the availability of in person appointments. The patient expressed understanding and agreed to proceed.   I discussed the assessment and treatment plan with the patient. The patient was provided an opportunity to ask questions and all were answered. The patient agreed with the plan and demonstrated an understanding of the instructions.   The patient was advised to call back or seek an in-person evaluation if the symptoms worsen or if the condition fails to improve as anticipated.  I provided 73 minutes of non-face-to-face time during this encounter. 50 minutes OT Group  23 minutes OT Evaluation  Donne Hazel, OT/L   Occupational Therapy Evaluation  Patient Details  Name: Kristin Blanchard MRN: [id number] Date of Birth: [birth year] Referring Provider (OT): Hillery Jacks   Encounter Date: 04/19/2021   OT End of Session - 04/19/21 1334     Visit Number 1    Number of Visits 20    Date for OT Re-Evaluation 05/17/21    Authorization Type [employer]    OT Start Time 1200    OT Stop Time 1250    OT Time Calculation (min) 50 min    Activity Tolerance Patient tolerated treatment well    Behavior During Therapy WFL for tasks assessed/performed             Past Medical History:  Diagnosis Date   Allergic rhinitis    Anxiety    Asthma    Depression    Headache    PCOS (polycystic ovarian syndrome)    Vitamin D deficiency     Past Surgical History:  Procedure Laterality Date   NO  PAST SURGERIES     WISDOM TOOTH EXTRACTION      There were no vitals filed for this visit.   Subjective Assessment - 04/19/21 1330     Currently in Pain? No/denies    Pain Score 0-No pain               [facility] OT Assessment - 04/19/21 0001       Assessment   Medical Diagnosis Major depression    Referring Provider (OT) Hillery Jacks      Precautions   Precautions None      Balance Screen   Has the patient fallen in the past 6 months No    Has the patient had a decrease in activity level because of a fear of falling?  No    Is the patient reluctant to leave their home because of a fear of falling?  No              OT Education - 04/19/21 1330     Education Details Educated on OT role within [facility] program in addition to physical symptomology of stress and its effects on the body, along with positive stress management tips/strategies    Person(s) Educated Patient    Methods Explanation;Handout    Comprehension Verbalized understanding              OT Short Term Goals - 04/19/21 1335  OT SHORT TERM GOAL #1   Title Pt will actively engage in OT group sessions throughout duration of PHP programming, in order to promote daily structure, social engagement, and opportunities to develop and utilize adaptive strategies to maximize functional performance in preparation for safe transition and integration back into school, work, and the community.    Time 4    Period Weeks    Status New    Target Date 05/17/21      OT SHORT TERM GOAL #2   Title Pt will practice and identify 1-3 adaptive coping strategies she can utilize, in order to safely manage increased depression/anxiety, with min cues, in preparation for safe and healthy reintegration back into the community at discharge.    Time 4    Period Weeks    Status New    Target Date 05/17/21      OT SHORT TERM GOAL #3   Title Pt will demonstrate improved ability to communicate feelings/needs/wants, as evidenced  by, active participation in OT sessions, throughout duration of PHP programming, in order to safely transition back into the community at discharge.    Time 4    Period Weeks    Status New    Target Date 05/17/21           Occupational Therapy Assessment 04/19/2021  Kristin Blanchard is a 25 y/o female with PMHx of severe depression and anxiety who was referred to the [facility] program from the [facility] after reports of worsening mental symptoms in the context of several psychosocial stressors. Pt reports feeling depressed, however notes "I don't think I have anything to be depressed about". Pt reports not "being where she wants to be" post-graduate life. Pt states she works a part-time job and moved home with her parents. Pt reports stress around finding a full-time position, moving out, and being financially secure. Pt enjoys hanging out with her friends and engaging in pottery. Pt reports desire to engage in PHP programming in order to manage identified stressors and to engage meaningfully in identified areas of occupation and ADL/iADLs. Upon approach, pt presents as calm, cooperative, and engaged for duration of OT Evaluation. Pt reports enjoying pottery and hanging with her friends and identifies goal for admission "gain coping skills and manage my stressors".   Precautions/Limitations: None noted/observed  Cognition: WFL   Visual Motor: WFL; wears glasses   Living Situation: Pt lives with Mom, Dad, and 3 younger siblings. Pt also reports a cousin who just moved to [place] lives in the home and pt does not get along with her.  School/Work: Pt recently graduated college in May; obtained her BA in Investment banker, corporate. 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 Pt reports only eating 1 meal a day.    Leisure  Interests and Hobbies: Enjoys hanging out with her friends and recently signed up for a pottery class  Social Support: Identifies strong supports, however reports she limits her support by declining to engage and isolating herself for fear of being a burden    What do you do when you are very stressed, angry, upset, sad or anxious? Isolate from others and Listen to music   What helps when you are not feeling well? Wrapping in a blanket, Taking a shower or bath, Calling a friend or family member, and Listening to music  What  are some things that make it MORE difficult for you when you are already upset? Loud noises, People staring at me, Being criticized, and Boredom/Lack of activities  Is there anything specific that you would like help with while you're in the partial hospitalization program? Coping Skills, Impulse Control, Relationships, Communication, Medication , Stress Management, Self-Care, Goal-setting, Sleep, and Self-esteem   What is your goal while you are here?  Gain coping skills and manage stressors  Assessment: Pt demonstrates behavior that inhibits/restricts participation in occupation and would benefit from skilled occupational therapy services to address current difficulties with symptom management, emotion regulation, socialization, stress management, time management, job readiness, financial wellness, health and nutrition, sleep hygiene, ADL/iADL performance and leisure participation, in preparation for reintegration and return to community at discharge.   Plan: Pt will participate in skilled occupational therapy sessions (group and/or individual) in order to promote daily structure, social engagement, and opportunities to develop and utilize adaptive strategies to maximize functional performance in preparation for safe transition and integration back into school, work, and/or the community at discharge. OT sessions will occur 4-5 x per week for 2-4 weeks.   Donne Hazel,  MOT, OTR/L  Group Session:  S: "I'm stressed about whether or not I should send this email to my boss or not"  O: Today's discussion focused on the topic of stress management. Group members worked collaboratively to create a Microbiologist identifying physical signs, behavioral signs, emotional/psychological, and cognitive signs of stress. Discussion then focused and encouraged group members to identify positive stress management strategies they could utilize in those moments to manage identified signs.   A: Kristin Blanchard was active in her participation of discussion and activity, sharing that she is currently stressed about whether or not she should send an email to her boss or not. She shared that she is trying to extend her contract at her position and would like to speak with the boss, however does not know if she's being "too much" by reaching out ahead of their 1:1 meeting. Pt appeared receptive to feedback from peers and expressed intention to act on drafting an email to ease the stress she is feeling.  P: Continue to attend PHP OT group sessions 5x week for 2 weeks to promote daily structure, social engagement, and opportunities to develop and utilize adaptive strategies to maximize functional performance in preparation for safe transition and integration back into school, work, and the community.     Plan - 04/19/21 1334     Clinical Impression Statement Kristin Blanchard is a 25 y/o female with PMHx of severe depression and anxiety who was referred to the [facility] program from the [facility] after reports of worsening mental symptoms in the context of several psychosocial stressors. Pt reports feeling depressed, however notes "I don't think I have anything to be depressed about". Pt reports not "being where she wants to be" post-graduate life. Pt states she works a part-time job and moved home with her parents. Pt reports stress around finding a full-time position, moving out, and being financially secure. Pt  enjoys hanging out with her friends and engaging in pottery. Pt reports desire to engage in PHP programming in order to manage identified stressors and to engage meaningfully in identified areas of occupation and ADL/iADLs.    OT Occupational Profile and History Problem Focused Assessment - Including review of records relating to presenting problem    Occupational performance deficits (Please refer to evaluation for details): ADL's;IADL's;Rest and Sleep;Education;Work;Leisure;Social Participation    Body Structure /  Function / Physical Skills ADL    Cognitive Skills Attention;Emotional;Energy/Drive;Learn;Memory;Perception;Problem Solve;Safety Awareness;Temperament/Personality;Thought;Understand    Psychosocial Skills Coping Strategies;Environmental  Adaptations;Habits;Interpersonal Interaction;Routines and Behaviors    Rehab Potential Good    Clinical Decision Making Limited treatment options, no task modification necessary    Comorbidities Affecting Occupational Performance: May have comorbidities impacting occupational performance    Modification or Assistance to Complete Evaluation  No modification of tasks or assist necessary to complete eval    OT Frequency 5x / week    OT Duration 4 weeks    OT Treatment/Interventions Self-care/ADL training;Patient/family education;Coping strategies training;Psychosocial skills training    Consulted and Agree with Plan of Care Patient             Patient will benefit from skilled therapeutic intervention in order to improve the following deficits and impairments:   Body Structure / Function / Physical Skills: ADL Cognitive Skills: Attention, Emotional, Energy/Drive, Learn, Memory, Perception, Problem Solve, Safety Awareness, Temperament/Personality, Thought, Understand Psychosocial Skills: Coping Strategies, Environmental  Adaptations, Habits, Interpersonal Interaction, Routines and Behaviors   Visit Diagnosis: Difficulty coping  Frontal lobe and  executive function deficit  Severe episode of recurrent major depressive disorder, without psychotic features [facility])    Problem List Patient Active Problem List   Diagnosis Date Noted   Severe episode of recurrent major depressive disorder, without psychotic features (HCC)    Adjustment disorder with mixed anxiety and depressed mood 05/31/2017   Patellar pain, right 12/10/2016   Irregular menses 05/07/2016   Nonintractable headache 05/07/2016   Vitamin D deficiency 08/24/2015   Obesity 10/22/2014   Asthma, chronic 02/09/2014   Environmental allergies 02/09/2014    04/19/2021  Donne Hazel, MOT, OTR/L  04/19/2021, 1:37 PM  [facility] PARTIAL HOSPITALIZATION PROGRAM [address] [place], [place], [address] Phone: [phone]   Fax:  [phone]  Name: Kristin Blanchard MRN: [id number] Date of Birth: [birth year]

## 2021-04-19 NOTE — Psych (Signed)
Virtual Visit via Video Note  I connected with Kristin Blanchard on 04/22/21 at  9:00 AM EST by a video enabled telemedicine application and verified that I am speaking with the correct person using two identifiers.  Location: Patient: Home Provider: Office   I discussed the limitations of evaluation and management by telemedicine and the availability of in person appointments. The patient expressed understanding and agreed to proceed.    I discussed the assessment and treatment plan with the patient. The patient was provided an opportunity to ask questions and all were answered. The patient agreed with the plan and demonstrated an understanding of the instructions.   The patient was advised to call back or seek an in-person evaluation if the symptoms worsen or if the condition fails to improve as anticipated.  I provided 15 minutes of non-face-to-face time during this encounter.   Kristin Rack, NP    Behavioral Health Partial Program Assessment Note  Date: 04/22/2021 Name: Kristin Blanchard MRN: [id number]  Chief Complaint: Kristin Blanchard reported " I have been having a hard time, I just need a break from life.   HPI: Kristin Blanchard is a 25 y.o.  female presents with worsening depression and passive suicidal ideations.  Reports she recently graduated and received a degree in political studies.  States she is struggled to obtain her undergraduate degree slightly discouraged to attend graduate school. States "I do not have a full-time job,I just though things will be different when I graduated."  Currently she is denying suicidal or homicidal ideations.  Denies auditory or visual hallucinations.  Denied history of physical sexual abuse in the past.  Denied illicit drug use or substance abuse history.  Reports her primary care provider prescribes Lexapro 20 mg.  (Healthy weight) States she struggles with depression and anxiety since she was younger.  Reports decreased concentration, feeling with  worthlessness and low mood.  reports she is currently followed by therapist Kristin Blanchard.  Discussed initiating Abilify 5 mg p.o. daily patient was receptive to plan.  Patient was enrolled in partial psychiatric program on 04/22/21.  Primary complaints include: anxiety, depression worse, feeling depressed, and increased irritability.  Onset of symptoms was gradual with gradually worsening course since that time. Psychosocial Stressors include the following: financial.  Denied previous inpatient admissions.  Reports family history of mental illness.  Father: Manic episode and diagnosed, sister ADHD, brother :bipolar, anxiety  I have reviewed the following documentation dated  : past psychiatric history, past medical history, and past social and family history  Complaints of Pain:  Past Psychiatric History:  First psychiatric contact   Currently in treatment with Lexapro and hydroxyzine.  Substance Abuse History: none Use of Alcohol: denied Use of Caffeine: denies use Use of over the counter:   Past Surgical History:  Procedure Laterality Date   NO PAST SURGERIES     WISDOM TOOTH EXTRACTION      Past Medical History:  Diagnosis Date   Allergic rhinitis    Anxiety    Asthma    Depression    Headache    PCOS (polycystic ovarian syndrome)    Vitamin D deficiency    Outpatient Encounter Medications as of 04/19/2021  Medication Sig   ARIPiprazole (ABILIFY) 5 MG tablet Take 1 tablet (5 mg total) by mouth daily.   albuterol (VENTOLIN HFA) 108 (90 Base) MCG/ACT inhaler Inhale 2 puffs into the lungs every 4 hours as needed to treat wheezes, cough, shortness of breath   busPIRone (BUSPAR) 7.5 MG tablet  Take 1 tablet (7.5 mg total) by mouth 3 (three) times daily as needed (anxiety).   cetirizine (ZYRTEC) 10 MG tablet Take 1 tablet (10 mg total) by mouth daily.   escitalopram (LEXAPRO) 20 MG tablet Take 1 tablet (20 mg total) by mouth daily.   hydrOXYzine (VISTARIL) 25 MG capsule Take 1  capsule (25 mg total) by mouth 3 (three) times daily as needed for anxiety.   Vitamin D, Ergocalciferol, (DRISDOL) 1.25 MG (50000 UNIT) CAPS capsule Take 1 capsule (50,000 Units total) by mouth every 7 (seven) days.   No facility-administered encounter medications on file as of 04/19/2021.   No Known Allergies  Social History   Tobacco Use   Smoking status: Never   Smokeless tobacco: Never  Substance Use Topics   Alcohol use: No   Functioning Relationships: good support system Education: College       Please specify degree: Investment banker, corporate Other Pertinent History: None Family History  Problem Relation Age of Onset   Diabetes Father    Hypertension Father    Other Mother        hypoglycemia   Sleep apnea Mother    Obesity Mother    Diabetes Paternal Aunt    Diabetes Paternal Uncle    Kidney disease Maternal Aunt      Review of Systems Constitutional: negative  Objective:  There were no vitals filed for this visit.  Physical Exam:   Mental Status Exam: Appearance:  Well groomed Psychomotor::  Within Normal Limits Attention span and concentration: Normal Behavior: calm, cooperative, and adequate rapport can be established Speech:  normal pitch Mood:  depressed Affect:  normal Thought Process:  Coherent Thought Content:  Logical Orientation:  person, place, and time/date Cognition:  grossly intact Insight:  Intact Judgment:  Fair Estimate of Intelligence: Average Fund of knowledge: Aware of current events Memory: Recent and remote intact Abnormal movements: None Gait and station: Normal  Assessment:  Diagnosis: Major depressive disorder recurrent Generalized anxiety disorder  Indications for admission: inpatient care required if not in partial hospital program  Plan: patient enrolled in Partial Hospitalization Program, patient's current medications are to be continued, the following medications are being prescribed hydroxyzine and Abilify 5 mg daily,  a comprehensive treatment plan will be developed, and side effects of medications have been reviewed with patient  Treatment options and alternatives reviewed with patient and patient understands the above plan.  Treatment plan was reviewed and agreed upon by NP T. Melvyn Neth and patient Kristin Blanchard  need for group services.    Kristin Rack, NP

## 2021-04-20 ENCOUNTER — Other Ambulatory Visit: Payer: Self-pay

## 2021-04-20 ENCOUNTER — Other Ambulatory Visit (HOSPITAL_COMMUNITY): Payer: 59 | Admitting: Occupational Therapy

## 2021-04-20 ENCOUNTER — Encounter (HOSPITAL_COMMUNITY): Payer: Self-pay

## 2021-04-20 ENCOUNTER — Other Ambulatory Visit (HOSPITAL_COMMUNITY): Payer: 59 | Admitting: Licensed Clinical Social Worker

## 2021-04-20 DIAGNOSIS — R41844 Frontal lobe and executive function deficit: Secondary | ICD-10-CM

## 2021-04-20 DIAGNOSIS — R4589 Other symptoms and signs involving emotional state: Secondary | ICD-10-CM

## 2021-04-20 DIAGNOSIS — F332 Major depressive disorder, recurrent severe without psychotic features: Secondary | ICD-10-CM

## 2021-04-20 NOTE — Therapy (Signed)
[facility] PARTIAL HOSPITALIZATION PROGRAM [address] [place], [place], [address] Phone: [phone]   Fax:  [phone] Virtual Visit via Video Note  I connected with Kristin Blanchard on 04/20/21 at  11:00 AM EST by a video enabled telemedicine application and verified that I am speaking with the correct person using two identifiers.  Location: Patient: Patient Home Provider: Home Office   I discussed the limitations of evaluation and management by telemedicine and the availability of in person appointments. The patient expressed understanding and agreed to proceed.    I discussed the assessment and treatment plan with the patient. The patient was provided an opportunity to ask questions and all were answered. The patient agreed with the plan and demonstrated an understanding of the instructions.   The patient was advised to call back or seek an in-person evaluation if the symptoms worsen or if the condition fails to improve as anticipated.  I provided 60 minutes of non-face-to-face time during this encounter.   Donne Hazel, OT/L   Occupational Therapy Treatment  Patient Details  Name: Kristin Blanchard MRN: [id number] Date of Birth: Dec 28, 1995 Referring Provider (OT): Hillery Jacks   Encounter Date: 04/20/2021   OT End of Session - 04/20/21 1232     Visit Number 2    Number of Visits 20    Date for OT Re-Evaluation 05/17/21    Authorization Type [employer]    OT Start Time 1100    OT Stop Time 1200    OT Time Calculation (min) 60 min    Activity Tolerance Patient tolerated treatment well    Behavior During Therapy WFL for tasks assessed/performed             Past Medical History:  Diagnosis Date   Allergic rhinitis    Anxiety    Asthma    Depression    Headache    PCOS (polycystic ovarian syndrome)    Vitamin D deficiency     Past Surgical History:  Procedure Laterality Date   NO PAST SURGERIES     WISDOM TOOTH EXTRACTION       There were no vitals filed for this visit.   Subjective Assessment - 04/20/21 1232     Currently in Pain? No/denies                                  OT Education - 04/20/21 1232     Education Details Educated on sleep hygiene and strategies to improve overall sleep quality    Person(s) Educated Patient    Methods Explanation;Handout    Comprehension Verbalized understanding              OT Short Term Goals - 04/20/21 1232       OT SHORT TERM GOAL #1   Title Pt will actively engage in OT group sessions throughout duration of PHP programming, in order to promote daily structure, social engagement, and opportunities to develop and utilize adaptive strategies to maximize functional performance in preparation for safe transition and integration back into school, work, and the community.    Status On-going      OT SHORT TERM GOAL #2   Title Pt will practice and identify 1-3 adaptive coping strategies she can utilize, in order to safely manage increased depression/anxiety, with min cues, in preparation for safe and healthy reintegration back into the community at discharge.    Status On-going  OT SHORT TERM GOAL #3   Title Pt will demonstrate improved ability to communicate feelings/needs/wants, as evidenced by, active participation in OT sessions, throughout duration of PHP programming, in order to safely transition back into the community at discharge.    Status On-going           Group Session:  S: "I struggle with falling asleep and I used to track my sleep with an Apple watch but I don't think it was very accurate"  O: Today's group discussion focused on topic of Sleep Hygiene. Patients reflected on the quality of sleep they typically receive and identified areas that need improvement. Group was given background information on sleep and sleep hygiene, including common sleep disorders. Group members also received information on how to  improve one's sleep and introduced a sleep diary as a tool that can be utilized to track sleep quality over a length of time. Group session ended with patients identifying one or more strategies they could utilize or implement into their sleep routine in order to improve overall sleep quality.    A: [name] was active in her participation of discussion and activity, sharing that she struggles with falling asleep, often laying in bed for long periods of time trying to get to sleep, but proving unsuccessful. Pt reports in the past she used an Apple watch to track her sleep, however denies accuracy or benefit. Pt appeared receptive and open to use of a sleep diary, along with following along with some of the recommendations for improving overall quality and sleep hygiene.   P: Continue to attend PHP OT group sessions 5x week for 2 weeks to promote daily structure, social engagement, and opportunities to develop and utilize adaptive strategies to maximize functional performance in preparation for safe transition and integration back into school, work, and the community. Plan to address topic of control in next OT group session.    Plan - 04/20/21 1232     Occupational performance deficits (Please refer to evaluation for details): ADL's;IADL's;Rest and Sleep;Education;Work;Leisure;Social Participation    Body Structure / Function / Physical Skills ADL    Cognitive Skills Attention;Emotional;Energy/Drive;Learn;Memory;Perception;Problem Solve;Safety Awareness;Temperament/Personality;Thought;Understand    Psychosocial Skills Coping Strategies;Environmental  Adaptations;Habits;Interpersonal Interaction;Routines and Behaviors             Patient will benefit from skilled therapeutic intervention in order to improve the following deficits and impairments:   Body Structure / Function / Physical Skills: ADL Cognitive Skills: Attention, Emotional, Energy/Drive, Learn, Memory, Perception, Problem Solve, Safety  Awareness, Temperament/Personality, Thought, Understand Psychosocial Skills: Coping Strategies, Environmental  Adaptations, Habits, Interpersonal Interaction, Routines and Behaviors   Visit Diagnosis: Difficulty coping  Frontal lobe and executive function deficit  Severe episode of recurrent major depressive disorder, without psychotic features [facility])    Problem List Patient Active Problem List   Diagnosis Date Noted   Severe episode of recurrent major depressive disorder, without psychotic features (HCC)    Adjustment disorder with mixed anxiety and depressed mood 05/31/2017   Patellar pain, right 12/10/2016   Irregular menses 05/07/2016   Nonintractable headache 05/07/2016   Vitamin D deficiency 08/24/2015   Obesity 10/22/2014   Asthma, chronic 02/09/2014   Environmental allergies 02/09/2014    04/20/2021  Donne Hazel, MOT, OTR/L  04/20/2021, 12:33 PM  [facility]AVIORAL [facility] PARTIAL HOSPITALIZATION PROGRAM [address] [place], [place], [address] Phone: [phone]   Fax:  [phone]  Name: Fahima Cifelli MRN: [id number] Date of Birth: [birth year]

## 2021-04-21 ENCOUNTER — Other Ambulatory Visit (HOSPITAL_COMMUNITY): Payer: 59 | Admitting: Licensed Clinical Social Worker

## 2021-04-21 ENCOUNTER — Encounter (HOSPITAL_COMMUNITY): Payer: Self-pay

## 2021-04-21 ENCOUNTER — Other Ambulatory Visit (HOSPITAL_COMMUNITY): Payer: 59 | Admitting: Occupational Therapy

## 2021-04-21 DIAGNOSIS — R41844 Frontal lobe and executive function deficit: Secondary | ICD-10-CM

## 2021-04-21 DIAGNOSIS — R4589 Other symptoms and signs involving emotional state: Secondary | ICD-10-CM

## 2021-04-21 DIAGNOSIS — F332 Major depressive disorder, recurrent severe without psychotic features: Secondary | ICD-10-CM | POA: Diagnosis not present

## 2021-04-21 NOTE — Therapy (Signed)
[facility] PARTIAL HOSPITALIZATION PROGRAM [address] [place], [place], [address] Phone: [phone]   Fax:  [phone] Virtual Visit via Video Note  I connected with Kristin Blanchard on 04/21/21 at  11:00 AM EST by a video enabled telemedicine application and verified that I am speaking with the correct person using two identifiers.  Location: Patient: Patient Home Provider: Home Office   I discussed the limitations of evaluation and management by telemedicine and the availability of in person appointments. The patient expressed understanding and agreed to proceed.   I discussed the assessment and treatment plan with the patient. The patient was provided an opportunity to ask questions and all were answered. The patient agreed with the plan and demonstrated an understanding of the instructions.   The patient was advised to call back or seek an in-person evaluation if the symptoms worsen or if the condition fails to improve as anticipated.  I provided 60 minutes of non-face-to-face time during this encounter.   Donne Hazel, OT/L   Occupational Therapy Treatment  Patient Details  Name: Kristin Blanchard MRN: [id number] Date of Birth: 1995-12-21 Referring Provider (OT): Hillery Jacks   Encounter Date: 04/21/2021   OT End of Session - 04/21/21 1249     Visit Number 3    Number of Visits 20    Date for OT Re-Evaluation 05/17/21    Authorization Type [employer]             Past Medical History:  Diagnosis Date   Allergic rhinitis    Anxiety    Asthma    Depression    Headache    PCOS (polycystic ovarian syndrome)    Vitamin D deficiency     Past Surgical History:  Procedure Laterality Date   NO PAST SURGERIES     WISDOM TOOTH EXTRACTION      There were no vitals filed for this visit.   Subjective Assessment - 04/21/21 1248     Currently in Pain? No/denies                                  OT Education  - 04/21/21 1248     Education Details Educated on identifying worry and utilized circle on control tool to categorize what we do and do not have control over    Person(s) Educated Patient    Methods Explanation;Handout    Comprehension Verbalized understanding              OT Short Term Goals - 04/20/21 1232       OT SHORT TERM GOAL #1   Title Pt will actively engage in OT group sessions throughout duration of PHP programming, in order to promote daily structure, social engagement, and opportunities to develop and utilize adaptive strategies to maximize functional performance in preparation for safe transition and integration back into school, work, and the community.    Status On-going      OT SHORT TERM GOAL #2   Title Pt will practice and identify 1-3 adaptive coping strategies she can utilize, in order to safely manage increased depression/anxiety, with min cues, in preparation for safe and healthy reintegration back into the community at discharge.    Status On-going      OT SHORT TERM GOAL #3   Title Pt will demonstrate improved ability to communicate feelings/needs/wants, as evidenced by, active participation in OT sessions, throughout duration of PHP programming,  in order to safely transition back into the community at discharge.    Status On-going           Group Session:  S: "worried about my plans for the weekend"  O: Group session encouraged increased participation and engagement through discussion focused on worry and our circle of control. Group reviewed a PowerPoint that discussed healthy vs unhealthy worry with specific examples provided. Discussion also focused on utilizing the circle of control outline to identify what is within our control, what we have influence on, and what is not in our control. Group members shared specific examples and worries and identified what categories they fell in within the circle of control.   A: Smt. was active and independent in  her participation of discussion and activity, appearing receptive and attentive to information provided on circle of control tool. Pt engaged in activity, identifying her small worry as what her plans for the weekend were going to be. Pt shared with the group the different elements of the control tool as it relates to her worry and appeared receptive to feedback. Denied any concerns or questions with use of tool.   P: Continue to attend PHP OT group sessions 5x week for 2 weeks to promote daily structure, social engagement, and opportunities to develop and utilize adaptive strategies to maximize functional performance in preparation for safe transition and integration back into school, work, and the community.   Plan - 04/21/21 1249     Occupational performance deficits (Please refer to evaluation for details): ADL's;IADL's;Rest and Sleep;Education;Work;Leisure;Social Participation    Body Structure / Function / Physical Skills ADL    Cognitive Skills Attention;Emotional;Energy/Drive;Learn;Memory;Perception;Problem Solve;Safety Awareness;Temperament/Personality;Thought;Understand    Psychosocial Skills Coping Strategies;Environmental  Adaptations;Habits;Interpersonal Interaction;Routines and Behaviors             Patient will benefit from skilled therapeutic intervention in order to improve the following deficits and impairments:   Body Structure / Function / Physical Skills: ADL Cognitive Skills: Attention, Emotional, Energy/Drive, Learn, Memory, Perception, Problem Solve, Safety Awareness, Temperament/Personality, Thought, Understand Psychosocial Skills: Coping Strategies, Environmental  Adaptations, Habits, Interpersonal Interaction, Routines and Behaviors   Visit Diagnosis: Difficulty coping  Frontal lobe and executive function deficit  Severe episode of recurrent major depressive disorder, without psychotic features [facility])    Problem List Patient Active Problem List   Diagnosis  Date Noted   Severe episode of recurrent major depressive disorder, without psychotic features (HCC)    Adjustment disorder with mixed anxiety and depressed mood 05/31/2017   Patellar pain, right 12/10/2016   Irregular menses 05/07/2016   Nonintractable headache 05/07/2016   Vitamin D deficiency 08/24/2015   Obesity 10/22/2014   Asthma, chronic 02/09/2014   Environmental allergies 02/09/2014    04/21/2021  Donne Hazel, MOT, OTR/L  04/21/2021, 12:49 PM  [facility] BEHAVIORAL HEALTH PARTIAL HOSPITALIZATION PROGRAM [address] [place], [place], [address] Phone: [phone]   Fax:  [phone]  Name: Kristin Blanchard MRN: [id number] Date of Birth: 1995/11/12

## 2021-04-24 ENCOUNTER — Other Ambulatory Visit (HOSPITAL_COMMUNITY): Payer: 59 | Admitting: Licensed Clinical Social Worker

## 2021-04-24 ENCOUNTER — Other Ambulatory Visit: Payer: Self-pay

## 2021-04-24 ENCOUNTER — Encounter (HOSPITAL_COMMUNITY): Payer: Self-pay

## 2021-04-24 ENCOUNTER — Other Ambulatory Visit (HOSPITAL_COMMUNITY): Payer: 59 | Admitting: Occupational Therapy

## 2021-04-24 DIAGNOSIS — F332 Major depressive disorder, recurrent severe without psychotic features: Secondary | ICD-10-CM

## 2021-04-24 DIAGNOSIS — R4589 Other symptoms and signs involving emotional state: Secondary | ICD-10-CM

## 2021-04-24 DIAGNOSIS — R41844 Frontal lobe and executive function deficit: Secondary | ICD-10-CM

## 2021-04-24 NOTE — Progress Notes (Signed)
Spoke with patient via Webex video call, used 2 identifiers to correctly identify patient. This is her first time in PHP referred by behavioral health after going for an assessment. Having increase in depression that is getting worse. She noticed that she was not sticking to her schedule and was procrastinating with tasks. She use to get up 2 hours before classes started and noticed that she was waking up 10 minutes before class. Turning in assignments with no time to spare when she was an early student that did things ahead of schedule. Has a poor appetite and poor sleep. Starts Abilify tonight and hoping it helps. On scale 1-10 as 10 being worst she rates depression at 2 and anxiety at 7. Denies SI/HI or AV hallucinations. PHQ9=21. No issues or complaints. No side effects from medication.

## 2021-04-24 NOTE — Therapy (Signed)
[facility] PARTIAL HOSPITALIZATION PROGRAM [address] [place], [place], [address] Phone: [phone]   Fax:  [phone] Virtual Visit via Video Note  I connected with Kristin Blanchard on 04/24/21 at  12:00 PM EST by a video enabled telemedicine application and verified that I am speaking with the correct person using two identifiers.  Location: Patient: Patient Home Provider: Home Office   I discussed the limitations of evaluation and management by telemedicine and the availability of in person appointments. The patient expressed understanding and agreed to proceed.   I discussed the assessment and treatment plan with the patient. The patient was provided an opportunity to ask questions and all were answered. The patient agreed with the plan and demonstrated an understanding of the instructions.   The patient was advised to call back or seek an in-person evaluation if the symptoms worsen or if the condition fails to improve as anticipated.  I provided 50 minutes of non-face-to-face time during this encounter.   Donne Hazel, OT/L   Occupational Therapy Treatment  Patient Details  Name: Kristin Blanchard MRN: [id number] Date of Birth: 17-Jan-1996 Referring Provider (OT): Hillery Jacks   Encounter Date: 04/24/2021   OT End of Session - 04/24/21 1330     Visit Number 4    Number of Visits 20    Date for OT Re-Evaluation 05/17/21    Authorization Type [employer]    OT Start Time 1200    OT Stop Time 1250    OT Time Calculation (min) 50 min    Activity Tolerance Patient tolerated treatment well    Behavior During Therapy WFL for tasks assessed/performed             Past Medical History:  Diagnosis Date   Allergic rhinitis    Anxiety    Asthma    Depression    Headache    PCOS (polycystic ovarian syndrome)    Vitamin D deficiency     Past Surgical History:  Procedure Laterality Date   NO PAST SURGERIES     WISDOM TOOTH EXTRACTION       There were no vitals filed for this visit.   Subjective Assessment - 04/24/21 1330     Currently in Pain? No/denies              OT Education - 04/24/21 1330     Education Details Educated on fight-or-flight response and use of relaxation strategies including deep breathing, guided imagery, and PMR    Person(s) Educated Patient    Methods Explanation;Handout    Comprehension Verbalized understanding              OT Short Term Goals - 04/20/21 1232       OT SHORT TERM GOAL #1   Title Pt will actively engage in OT group sessions throughout duration of PHP programming, in order to promote daily structure, social engagement, and opportunities to develop and utilize adaptive strategies to maximize functional performance in preparation for safe transition and integration back into school, work, and the community.    Status On-going      OT SHORT TERM GOAL #2   Title Pt will practice and identify 1-3 adaptive coping strategies she can utilize, in order to safely manage increased depression/anxiety, with min cues, in preparation for safe and healthy reintegration back into the community at discharge.    Status On-going      OT SHORT TERM GOAL #3   Title Pt will demonstrate improved  ability to communicate feelings/needs/wants, as evidenced by, active participation in OT sessions, throughout duration of PHP programming, in order to safely transition back into the community at discharge.    Status On-going           Group Session:  S: "I like to watch reality TV shows to relax or calm myself down, because they are pretty mindless"  O:  Group began with a warm-up activity and group members were encouraged to share and identify ways in which they have practiced relaxation in the past, including any specific strategies or hobbies. Today's discussion focused on the topic of RELAXATION and patients reviewed and engaged in a variety of relaxation strategies techniques including  deep breathing, guided imagery/meditation, and progressive muscle relaxation. After each strategy was reviewed, group members were invited to engage in an active practice of relaxation strategies identified. Review and background on the fight-or-flight response was also provided.   A: Kristin Blanchard was active and independent in her participation of discussion and activity, sharing that she likes to watch reality TV shows as a way to relax herself and calm down when experiencing panic or anxiety. She shared that it is "mindless" to watch reality TV and does not take much of her attention to do so, especially when feeling stressed. Pt appeared receptive to additional relaxation strategies reviewed and identified "the beach at sunset" as a relaxing scene in which they would like to use a guided imagery for.   P: Continue to attend PHP OT group sessions 5x week for 2 weeks to promote daily structure, social engagement, and opportunities to develop and utilize adaptive strategies to maximize functional performance in preparation for safe transition and integration back into school, work, and the community.   Plan - 04/24/21 1330     Occupational performance deficits (Please refer to evaluation for details): ADL's;IADL's;Rest and Sleep;Education;Work;Leisure;Social Participation    Body Structure / Function / Physical Skills ADL    Cognitive Skills Attention;Emotional;Energy/Drive;Learn;Memory;Perception;Problem Solve;Safety Awareness;Temperament/Personality;Thought;Understand    Psychosocial Skills Coping Strategies;Environmental  Adaptations;Habits;Interpersonal Interaction;Routines and Behaviors             Patient will benefit from skilled therapeutic intervention in order to improve the following deficits and impairments:   Body Structure / Function / Physical Skills: ADL Cognitive Skills: Attention, Emotional, Energy/Drive, Learn, Memory, Perception, Problem Solve, Safety Awareness,  Temperament/Personality, Thought, Understand Psychosocial Skills: Coping Strategies, Environmental  Adaptations, Habits, Interpersonal Interaction, Routines and Behaviors   Visit Diagnosis: Difficulty coping  Frontal lobe and executive function deficit  Severe episode of recurrent major depressive disorder, without psychotic features [facility])    Problem List Patient Active Problem List   Diagnosis Date Noted   Severe episode of recurrent major depressive disorder, without psychotic features (HCC)    Adjustment disorder with mixed anxiety and depressed mood 05/31/2017   Patellar pain, right 12/10/2016   Irregular menses 05/07/2016   Nonintractable headache 05/07/2016   Vitamin D deficiency 08/24/2015   Obesity 10/22/2014   Asthma, chronic 02/09/2014   Environmental allergies 02/09/2014    04/24/2021  Donne Hazel, MOT, OTR/L  04/24/2021, 1:31 PM  [facility] PARTIAL HOSPITALIZATION PROGRAM [address] [place], [place], [address] Phone: [phone]   Fax:  [phone]  Name: Kristin Blanchard MRN: [id number] Date of Birth: 05-04-1996

## 2021-04-25 ENCOUNTER — Other Ambulatory Visit (HOSPITAL_COMMUNITY): Payer: 59

## 2021-04-25 ENCOUNTER — Other Ambulatory Visit: Payer: Self-pay

## 2021-04-26 ENCOUNTER — Other Ambulatory Visit (HOSPITAL_COMMUNITY): Payer: 59

## 2021-05-01 ENCOUNTER — Other Ambulatory Visit (HOSPITAL_COMMUNITY): Payer: 59 | Admitting: Licensed Clinical Social Worker

## 2021-05-01 ENCOUNTER — Other Ambulatory Visit: Payer: Self-pay

## 2021-05-01 ENCOUNTER — Encounter (HOSPITAL_COMMUNITY): Payer: Self-pay

## 2021-05-01 ENCOUNTER — Other Ambulatory Visit (HOSPITAL_COMMUNITY): Payer: 59 | Admitting: Occupational Therapy

## 2021-05-01 DIAGNOSIS — F332 Major depressive disorder, recurrent severe without psychotic features: Secondary | ICD-10-CM | POA: Diagnosis not present

## 2021-05-01 DIAGNOSIS — R4589 Other symptoms and signs involving emotional state: Secondary | ICD-10-CM

## 2021-05-01 DIAGNOSIS — R41844 Frontal lobe and executive function deficit: Secondary | ICD-10-CM

## 2021-05-01 NOTE — Therapy (Signed)
[facility] PARTIAL HOSPITALIZATION PROGRAM [address] [place], [place], [address] Phone: [phone]   Fax:  [phone] Virtual Visit via Video Note  I connected with Kristin Blanchard on 05/01/21 at  11:25 AM EST by a video enabled telemedicine application and verified that I am speaking with the correct person using two identifiers.  Location: Patient: Patient Home Provider: Home Office   I discussed the limitations of evaluation and management by telemedicine and the availability of in person appointments. The patient expressed understanding and agreed to proceed.   I discussed the assessment and treatment plan with the patient. The patient was provided an opportunity to ask questions and all were answered. The patient agreed with the plan and demonstrated an understanding of the instructions.   The patient was advised to call back or seek an in-person evaluation if the symptoms worsen or if the condition fails to improve as anticipated.  I provided 60 minutes of non-face-to-face time during this encounter.   Donne Hazel, OT/L   Occupational Therapy Treatment  Patient Details  Name: Kristin Blanchard MRN: [id number] Date of Birth: Aug 26, 1995 Referring Provider (OT): Hillery Jacks   Encounter Date: 05/01/2021   OT End of Session - 05/01/21 1331     Visit Number 5    Number of Visits 20    Date for OT Re-Evaluation 05/17/21    Authorization Type [employer]    OT Start Time 1125    OT Stop Time 1225    OT Time Calculation (min) 60 min    Activity Tolerance Patient tolerated treatment well    Behavior During Therapy WFL for tasks assessed/performed             Past Medical History:  Diagnosis Date   Allergic rhinitis    Anxiety    Asthma    Depression    Headache    PCOS (polycystic ovarian syndrome)    Vitamin D deficiency     Past Surgical History:  Procedure Laterality Date   NO PAST SURGERIES     WISDOM TOOTH EXTRACTION       There were no vitals filed for this visit.   Subjective Assessment - 05/01/21 1330     Currently in Pain? No/denies                                  OT Education - 05/01/21 1331     Education Details Educated on different factors that contribute to our ability to manage our time, along with specific time management tips/strategies, including procrastination    Person(s) Educated Patient    Methods Explanation;Handout    Comprehension Verbalized understanding              OT Short Term Goals - 04/20/21 1232       OT SHORT TERM GOAL #1   Title Pt will actively engage in OT group sessions throughout duration of PHP programming, in order to promote daily structure, social engagement, and opportunities to develop and utilize adaptive strategies to maximize functional performance in preparation for safe transition and integration back into school, work, and the community.    Status On-going      OT SHORT TERM GOAL #2   Title Pt will practice and identify 1-3 adaptive coping strategies she can utilize, in order to safely manage increased depression/anxiety, with min cues, in preparation for safe and healthy reintegration back into the  community at discharge.    Status On-going      OT SHORT TERM GOAL #3   Title Pt will demonstrate improved ability to communicate feelings/needs/wants, as evidenced by, active participation in OT sessions, throughout duration of PHP programming, in order to safely transition back into the community at discharge.    Status On-going           Group Session:  S: "I use different sticky notes for different things, but I love the idea of this giant sticky note"  O: Group began with a warm-up ice breaker activity where patients were encouraged to reflect on the scenario "If you had 86,400 dollars dropped into your bank account at midnight and 24 hours to spend it, what you use the money for? The only rule was that any money  not used disappeared after 24 hours." Warm-up activity was used as an Web designer and a way to connect that similar to the scenario of money, we do not get time back and there are 86,400 seconds in a day. Warm-up transitioned into today's group focused on topic of Time Management. Group members identified ways in which they currently struggle with managing their time and discussion focused on alternative strategies to recognize how we can be more productive and intentional with our time and managing it appropriately. Group members also discussed specific strategies to overcome procrastination.   A: Kristin Blanchard was active and independent in her participation of discussion and activity, sharing that she keeps herself organized right now with different colored sticky notes for different levels of priorities. She shared that the use of a giant sticky note that a peer offered seemed appealing and wanted to look into that tool. She shared that she struggles with either being too busy and "over worked" or not busy at all and trying to find things to fill her day. Pt expressed interest in wanting to having more of a "balance" when it comes to her time management. Pt also identified procrastination as something she struggles with and just "not wanting to do it."  P: Continue to attend PHP OT group sessions 5x week for 1 weeks to promote daily structure, social engagement, and opportunities to develop and utilize adaptive strategies to maximize functional performance in preparation for safe transition and integration back into school, work, and the community. Plan to address topic of procrastination in next OT group session.    Plan - 05/01/21 1331     Occupational performance deficits (Please refer to evaluation for details): ADL's;IADL's;Rest and Sleep;Education;Work;Leisure;Social Participation    Body Structure / Function / Physical Skills ADL    Cognitive Skills  Attention;Emotional;Energy/Drive;Learn;Memory;Perception;Problem Solve;Safety Awareness;Temperament/Personality;Thought;Understand    Psychosocial Skills Coping Strategies;Environmental  Adaptations;Habits;Interpersonal Interaction;Routines and Behaviors             Patient will benefit from skilled therapeutic intervention in order to improve the following deficits and impairments:   Body Structure / Function / Physical Skills: ADL Cognitive Skills: Attention, Emotional, Energy/Drive, Learn, Memory, Perception, Problem Solve, Safety Awareness, Temperament/Personality, Thought, Understand Psychosocial Skills: Coping Strategies, Environmental  Adaptations, Habits, Interpersonal Interaction, Routines and Behaviors   Visit Diagnosis: Difficulty coping  Frontal lobe and executive function deficit  Severe episode of recurrent major depressive disorder, without psychotic features [facility])    Problem List Patient Active Problem List   Diagnosis Date Noted   Severe episode of recurrent major depressive disorder, without psychotic features (HCC)    Adjustment disorder with mixed anxiety and depressed mood 05/31/2017   Patellar  pain, right 12/10/2016   Irregular menses 05/07/2016   Nonintractable headache 05/07/2016   Vitamin D deficiency 08/24/2015   Obesity 10/22/2014   Asthma, chronic 02/09/2014   Environmental allergies 02/09/2014    05/01/2021  Donne Hazel, MOT, OTR/L  05/01/2021, 1:31 PM  [facility] PARTIAL HOSPITALIZATION PROGRAM [address] [place], [place], [address] Phone: [phone]   Fax:  [phone]  Name: Kristin Blanchard MRN: [id number] Date of Birth: [birth year]

## 2021-05-01 NOTE — Progress Notes (Unsigned)
  Office: [phone]  /  Fax: [phone]    Date: May 15, 2021   Appointment Start Time: *** Duration: *** minutes Provider: Lawerance Cruel, Psy.D. Type of Session: Individual Therapy  Location of Patient: {gbptloc:23249} Location of Provider: Provider's Home (private office) Type of Contact: Telepsychological Visit via MyChart Video Visit  Session Content: Kristin Blanchard is a 25 y.o. female presenting for a follow-up appointment to address the previously established treatment goal of increasing coping skills.Today's appointment was a telepsychological visit due to COVID-19. Kristin Blanchard provided verbal consent for today's telepsychological appointment and she is aware she is responsible for securing confidentiality on her end of the session. Prior to proceeding with today's appointment, Kristin Blanchard's physical location at the time of this appointment was obtained as well a phone number she could be reached at in the event of technical difficulties. Kristin Blanchard and this provider participated in today's telepsychological service.   This provider conducted a brief check-in. *** Kristin Blanchard was receptive to today's appointment as evidenced by openness to sharing, responsiveness to feedback, and {gbreceptiveness:23401}.  Mental Status Examination:  Appearance: {Appearance:22431} Behavior: {Behavior:22445} Mood: {gbmood:21757} Affect: {Affect:22436} Speech: {Speech:22432} Eye Contact: {Eye Contact:22433} Psychomotor Activity: {Motor Activity:22434} Gait: {gbgait:23404} Thought Process: {thought process:22448}  Thought Content/Perception: {disturbances:22451} Orientation: {Orientation:22437} Memory/Concentration: {gbcognition:22449} Insight/Judgment: {Insight:22446}  Interventions:  {Interventions for Progress Notes:23405}  DSM-5 Diagnosis(es):  F50.9 Unspecified Feeding or Eating Disorder; F41.1 Generalized Anxiety Disorder, F33.1  Major Depressive Disorder, Recurrent Episode, Moderate, and 314.01 (F90.9) Unspecified  Attention-Deficit/Hyperactivity Disorder   Treatment Goal & Progress: During the initial appointment with this provider, the following treatment goal was established: increase coping skills. Kristin Blanchard has demonstrated progress in her goal as evidenced by {gbtxprogress:22839}. Kristin Blanchard also {gbtxprogress2:22951}.  Plan: The next appointment will be scheduled in {gbweeks:21758}, which will be {gbtxmodality:23402}. The next session will focus on {Plan for Next Appointment:23400}.

## 2021-05-02 ENCOUNTER — Ambulatory Visit: Payer: 59 | Admitting: Psychologist

## 2021-05-02 ENCOUNTER — Other Ambulatory Visit (HOSPITAL_COMMUNITY): Payer: 59

## 2021-05-02 ENCOUNTER — Ambulatory Visit (INDEPENDENT_AMBULATORY_CARE_PROVIDER_SITE_OTHER): Payer: 59 | Admitting: Psychologist

## 2021-05-02 ENCOUNTER — Other Ambulatory Visit: Payer: Self-pay

## 2021-05-02 DIAGNOSIS — F322 Major depressive disorder, single episode, severe without psychotic features: Secondary | ICD-10-CM

## 2021-05-03 ENCOUNTER — Encounter (HOSPITAL_COMMUNITY): Payer: Self-pay

## 2021-05-03 ENCOUNTER — Other Ambulatory Visit (HOSPITAL_COMMUNITY): Payer: 59 | Admitting: Occupational Therapy

## 2021-05-03 ENCOUNTER — Other Ambulatory Visit (HOSPITAL_COMMUNITY): Payer: 59 | Admitting: Licensed Clinical Social Worker

## 2021-05-03 DIAGNOSIS — F332 Major depressive disorder, recurrent severe without psychotic features: Secondary | ICD-10-CM

## 2021-05-03 DIAGNOSIS — F331 Major depressive disorder, recurrent, moderate: Secondary | ICD-10-CM

## 2021-05-03 DIAGNOSIS — R41844 Frontal lobe and executive function deficit: Secondary | ICD-10-CM

## 2021-05-03 DIAGNOSIS — R4589 Other symptoms and signs involving emotional state: Secondary | ICD-10-CM

## 2021-05-03 MED ORDER — HYDROXYZINE PAMOATE 25 MG PO CAPS: 25.0000 mg | ORAL_CAPSULE | Freq: Three times a day (TID) | ORAL | 0 refills | Status: DC | PRN

## 2021-05-03 NOTE — Therapy (Signed)
[facility] PARTIAL HOSPITALIZATION PROGRAM [address] [place], [place], [address] Phone: [phone]   Fax:  [phone] Virtual Visit via Video Note  I connected with Denesha Brouse on 05/03/21 at  12:00 PM EST by a video enabled telemedicine application and verified that I am speaking with the correct person using two identifiers.  Location: Patient: Patient Home Provider: Home Office   I discussed the limitations of evaluation and management by telemedicine and the availability of in person appointments. The patient expressed understanding and agreed to proceed.   I discussed the assessment and treatment plan with the patient. The patient was provided an opportunity to ask questions and all were answered. The patient agreed with the plan and demonstrated an understanding of the instructions.   The patient was advised to call back or seek an in-person evaluation if the symptoms worsen or if the condition fails to improve as anticipated.  I provided 50 minutes of non-face-to-face time during this encounter.   Donne Hazel, OT/L   Occupational Therapy Treatment  Patient Details  Name: Kristin Blanchard MRN: [id number] Date of Birth: 11-22-1995 Referring Provider (OT): Hillery Jacks   Encounter Date: 05/03/2021   OT End of Session - 05/03/21 1355     Visit Number 6    Number of Visits 20    Date for OT Re-Evaluation 05/17/21    Authorization Type [employer]    OT Start Time 1200    OT Stop Time 1250    OT Time Calculation (min) 50 min    Activity Tolerance Patient tolerated treatment well    Behavior During Therapy WFL for tasks assessed/performed             Past Medical History:  Diagnosis Date   Allergic rhinitis    Anxiety    Asthma    Depression    Headache    PCOS (polycystic ovarian syndrome)    Vitamin D deficiency     Past Surgical History:  Procedure Laterality Date   NO PAST SURGERIES     WISDOM TOOTH EXTRACTION       There were no vitals filed for this visit.   Subjective Assessment - 05/03/21 1355     Currently in Pain? No/denies                                  OT Education - 05/03/21 1355     Education Details Educated on different communication styles and identified strategies/tips to practice being more assertive    Person(s) Educated Patient    Methods Explanation;Handout    Comprehension Verbalized understanding              OT Short Term Goals - 04/20/21 1232       OT SHORT TERM GOAL #1   Title Pt will actively engage in OT group sessions throughout duration of PHP programming, in order to promote daily structure, social engagement, and opportunities to develop and utilize adaptive strategies to maximize functional performance in preparation for safe transition and integration back into school, work, and the community.    Status On-going      OT SHORT TERM GOAL #2   Title Pt will practice and identify 1-3 adaptive coping strategies she can utilize, in order to safely manage increased depression/anxiety, with min cues, in preparation for safe and healthy reintegration back into the community at discharge.    Status On-going  OT SHORT TERM GOAL #3   Title Pt will demonstrate improved ability to communicate feelings/needs/wants, as evidenced by, active participation in OT sessions, throughout duration of PHP programming, in order to safely transition back into the community at discharge.    Status On-going           Group Session:  S: "I am 110% a passive communicator in every role and avenue of my life"  O: Today's group focused on topic of Communication Styles. Group members were educated on the different styles including passive, aggressive, and assertive communication. Members shared and reflected on which style they most often find themselves communicating in and how to transition to a more assertive approach.   A: Juno was active in her  participation of discussion and activity, sharing that she strongly relates to the passive style of communication by "110%". She shared that she is a people pleaser and struggles to advocate for herself before others. She appeared both open and receptive to education provided during discussion.  P: Continue to attend PHP OT group sessions 5x week for 2 weeks to promote daily structure, social engagement, and opportunities to develop and utilize adaptive strategies to maximize functional performance in preparation for safe transition and integration back into school, work, and the community. Plan to address topic of assertiveness in next OT group session.   Plan - 05/03/21 1355     Occupational performance deficits (Please refer to evaluation for details): ADL's;IADL's;Rest and Sleep;Education;Work;Leisure;Social Participation    Body Structure / Function / Physical Skills ADL    Cognitive Skills Attention;Emotional;Energy/Drive;Learn;Memory;Perception;Problem Solve;Safety Awareness;Temperament/Personality;Thought;Understand    Psychosocial Skills Coping Strategies;Environmental  Adaptations;Habits;Interpersonal Interaction;Routines and Behaviors             Patient will benefit from skilled therapeutic intervention in order to improve the following deficits and impairments:   Body Structure / Function / Physical Skills: ADL Cognitive Skills: Attention, Emotional, Energy/Drive, Learn, Memory, Perception, Problem Solve, Safety Awareness, Temperament/Personality, Thought, Understand Psychosocial Skills: Coping Strategies, Environmental  Adaptations, Habits, Interpersonal Interaction, Routines and Behaviors   Visit Diagnosis: Difficulty coping  Frontal lobe and executive function deficit  Severe episode of recurrent major depressive disorder, without psychotic features [facility])    Problem List Patient Active Problem List   Diagnosis Date Noted   Severe episode of recurrent major  depressive disorder, without psychotic features (HCC)    Adjustment disorder with mixed anxiety and depressed mood 05/31/2017   Patellar pain, right 12/10/2016   Irregular menses 05/07/2016   Nonintractable headache 05/07/2016   Vitamin D deficiency 08/24/2015   Obesity 10/22/2014   Asthma, chronic 02/09/2014   Environmental allergies 02/09/2014    05/03/2021  Donne Hazel, MOT, OTR/L  05/03/2021, 1:55 PM  [facility] PARTIAL HOSPITALIZATION PROGRAM [address] [place], [place], [address] Phone: [phone]   Fax:  [phone]  Name: Yaquelin Langelier MRN: [id number] Date of Birth: May 13, 1996

## 2021-05-04 ENCOUNTER — Other Ambulatory Visit (HOSPITAL_COMMUNITY): Payer: 59 | Attending: Psychiatry | Admitting: Licensed Clinical Social Worker

## 2021-05-04 ENCOUNTER — Encounter (HOSPITAL_COMMUNITY): Payer: Self-pay

## 2021-05-04 ENCOUNTER — Other Ambulatory Visit: Payer: Self-pay

## 2021-05-04 ENCOUNTER — Other Ambulatory Visit (HOSPITAL_COMMUNITY): Payer: 59 | Admitting: Occupational Therapy

## 2021-05-04 DIAGNOSIS — Z79899 Other long term (current) drug therapy: Secondary | ICD-10-CM | POA: Insufficient documentation

## 2021-05-04 DIAGNOSIS — F332 Major depressive disorder, recurrent severe without psychotic features: Secondary | ICD-10-CM

## 2021-05-04 DIAGNOSIS — R41844 Frontal lobe and executive function deficit: Secondary | ICD-10-CM

## 2021-05-04 DIAGNOSIS — R4589 Other symptoms and signs involving emotional state: Secondary | ICD-10-CM

## 2021-05-04 NOTE — Therapy (Signed)
[facility] PARTIAL HOSPITALIZATION PROGRAM [address] [place], [place], [address] Phone: [phone]   Fax:  [phone] Virtual Visit via Video Note  I connected with Kristin Blanchard on 05/04/21 at  10:45 AM EST by a video enabled telemedicine application and verified that I am speaking with the correct person using two identifiers.  Location: Patient: Patient Home Provider: Home Office   I discussed the limitations of evaluation and management by telemedicine and the availability of in person appointments. The patient expressed understanding and agreed to proceed.   I discussed the assessment and treatment plan with the patient. The patient was provided an opportunity to ask questions and all were answered. The patient agreed with the plan and demonstrated an understanding of the instructions.   The patient was advised to call back or seek an in-person evaluation if the symptoms worsen or if the condition fails to improve as anticipated.  I provided 60 minutes of non-face-to-face time during this encounter.   Donne Hazel, OT/L   Occupational Therapy Treatment  Patient Details  Name: Kristin Blanchard MRN: [id number] Date of Birth: 24-Jul-1995 Referring Provider (OT): Hillery Jacks   Encounter Date: 05/04/2021   OT End of Session - 05/04/21 1258     Visit Number 7    Number of Visits 20    Date for OT Re-Evaluation 05/17/21    Authorization Type [employer]    OT Start Time 1045    OT Stop Time 1145    OT Time Calculation (min) 60 min    Activity Tolerance Patient tolerated treatment well    Behavior During Therapy WFL for tasks assessed/performed             Past Medical History:  Diagnosis Date   Allergic rhinitis    Anxiety    Asthma    Depression    Headache    PCOS (polycystic ovarian syndrome)    Vitamin D deficiency     Past Surgical History:  Procedure Laterality Date   NO PAST SURGERIES     WISDOM TOOTH EXTRACTION       There were no vitals filed for this visit.   Subjective Assessment - 05/04/21 1258     Currently in Pain? No/denies             OT Education - 05/04/21 1258     Education Details Educated on different communication styles with strategies to become more assertive with use of XYZ communication tool    Person(s) Educated Patient    Methods Explanation;Handout    Comprehension Verbalized understanding              OT Short Term Goals - 04/20/21 1232       OT SHORT TERM GOAL #1   Title Pt will actively engage in OT group sessions throughout duration of PHP programming, in order to promote daily structure, social engagement, and opportunities to develop and utilize adaptive strategies to maximize functional performance in preparation for safe transition and integration back into school, work, and the community.    Status On-going      OT SHORT TERM GOAL #2   Title Pt will practice and identify 1-3 adaptive coping strategies she can utilize, in order to safely manage increased depression/anxiety, with min cues, in preparation for safe and healthy reintegration back into the community at discharge.    Status On-going      OT SHORT TERM GOAL #3   Title Pt will demonstrate improved  ability to communicate feelings/needs/wants, as evidenced by, active participation in OT sessions, throughout duration of PHP programming, in order to safely transition back into the community at discharge.    Status On-going           Group Session:  S: "Honestly, I wouldn't say no to my friend and then once it's happened one too many times, I will find someone else to be assertive for me."  O: Group began with a reflection from previous OT session focused on communication styles and group members re-iterated what was learned during previous session. Members shared and reflected on any opportunities they were presented with last evening to practice their assertiveness skills or recognize  patterns of communication observed. Today's group focused on assertiveness skills training and use of the XYZ* assertive communication tool was introduced. The XYZ communication tool states: I feel X when you do Y in situation Z and I would like _________. X is the emotion, Y is the specific behavior, and Z is the specific situation. Group members each formulated their own XYZ statement and shared with the group to discuss and offer feedback. Additional tips and strategies to practice being assertive were also introduced and discussed.  A: Kristin Blanchard was active in her participation of discussion and activity, sharing that she struggles with saying no and often gets taken advantage of by her friends. She appeared receptive to assertiveness strategies and asked several clarifying questions and engaged appropriately in discussion.   P: Continue to attend PHP OT group sessions 5x week for 2 weeks to promote daily structure, social engagement, and opportunities to develop and utilize adaptive strategies to maximize functional performance in preparation for safe transition and integration back into school, work, and the community.   Plan - 05/04/21 1258     Occupational performance deficits (Please refer to evaluation for details): ADL's;IADL's;Rest and Sleep;Education;Work;Leisure;Social Participation    Body Structure / Function / Physical Skills ADL    Cognitive Skills Attention;Emotional;Energy/Drive;Learn;Memory;Perception;Problem Solve;Safety Awareness;Temperament/Personality;Thought;Understand    Psychosocial Skills Coping Strategies;Environmental  Adaptations;Habits;Interpersonal Interaction;Routines and Behaviors             Patient will benefit from skilled therapeutic intervention in order to improve the following deficits and impairments:   Body Structure / Function / Physical Skills: ADL Cognitive Skills: Attention, Emotional, Energy/Drive, Learn, Memory, Perception, Problem Solve, Safety  Awareness, Temperament/Personality, Thought, Understand Psychosocial Skills: Coping Strategies, Environmental  Adaptations, Habits, Interpersonal Interaction, Routines and Behaviors   Visit Diagnosis: Difficulty coping  Frontal lobe and executive function deficit  Severe episode of recurrent major depressive disorder, without psychotic features [facility])    Problem List Patient Active Problem List   Diagnosis Date Noted   Severe episode of recurrent major depressive disorder, without psychotic features (HCC)    Adjustment disorder with mixed anxiety and depressed mood 05/31/2017   Patellar pain, right 12/10/2016   Irregular menses 05/07/2016   Nonintractable headache 05/07/2016   Vitamin D deficiency 08/24/2015   Obesity 10/22/2014   Asthma, chronic 02/09/2014   Environmental allergies 02/09/2014    05/04/2021  Donne Hazel, MOT, OTR/L 05/04/2021, 12:59 PM  [facility] PARTIAL HOSPITALIZATION PROGRAM [address] [place], [place], [address] Phone: [phone]   Fax:  [phone]  Name: Kristin Blanchard MRN: [id number] Date of Birth: 27-Aug-1995

## 2021-05-05 ENCOUNTER — Other Ambulatory Visit (HOSPITAL_COMMUNITY): Payer: 59

## 2021-05-05 ENCOUNTER — Other Ambulatory Visit: Payer: Self-pay

## 2021-05-05 ENCOUNTER — Other Ambulatory Visit (HOSPITAL_COMMUNITY): Payer: 59 | Admitting: Professional

## 2021-05-05 DIAGNOSIS — F332 Major depressive disorder, recurrent severe without psychotic features: Secondary | ICD-10-CM

## 2021-05-05 DIAGNOSIS — F331 Major depressive disorder, recurrent, moderate: Secondary | ICD-10-CM

## 2021-05-05 NOTE — Progress Notes (Signed)
Virtual Visit via Video Note  I connected with Kristin Blanchard on 05/16/21 at  9:00 AM EST by a video enabled telemedicine application and verified that I am speaking with the correct person using two identifiers.  Location: Patient: home Provider: Office    I discussed the limitations of evaluation and management by telemedicine and the availability of in person appointments. The patient expressed understanding and agreed to proceed.   I discussed the assessment and treatment plan with the patient. The patient was provided an opportunity to ask questions and all were answered. The patient agreed with the plan and demonstrated an understanding of the instructions.   The patient was advised to call back or seek an in-person evaluation if the symptoms worsen or if the condition fails to improve as anticipated.  I provided 15 minutes of non-face-to-face time during this encounter.   [name], NP   [facility] MD/PA/NP OP Progress Note  05/16/2021 12:49 PM Kristin Blanchard  MRN:  [id number]  Chief Complaint:  " I just can't get out of this rut."  HPI:  Kristin Blanchard was seen and evaluated due to worsening depression. Continues to report symptoms of worries and experience  rumination with her future plans about being a "adult."  Stated concerns about being further along in her career/ college plan than she is.    Kristin Blanchard is denying suicidal or homicidal ideations.  Denies auditory or visual hallucinations.  Discussed initiating Abilify 5 mg daily to her current SSRI. Reported that her medication are prescribed by her health and wellness doctor.  She reports a good appetite.  States she is resting well throughout the night. Patient to continue Partial Hospitalization Programming.   Visit Diagnosis:    ICD-10-CM   1. MDD (major depressive disorder), recurrent episode, moderate (HCC)  F33.1     2. Severe episode of recurrent major depressive disorder, without psychotic features (HCC)  F33.2       Past  Psychiatric History:   Past Medical History:  Past Medical History:  Diagnosis Date   Allergic rhinitis    Anxiety    Asthma    Depression    Headache    PCOS (polycystic ovarian syndrome)    Vitamin D deficiency     Past Surgical History:  Procedure Laterality Date   NO PAST SURGERIES     WISDOM TOOTH EXTRACTION      Family Psychiatric History:  Family History:  Family History  Problem Relation Age of Onset   Other Mother        hypoglycemia   Sleep apnea Mother    Obesity Mother    Diabetes Father    Hypertension Father    Depression Sister    Anxiety disorder Sister    Bipolar disorder Brother    Kidney disease Maternal Aunt    Diabetes Paternal Aunt    Diabetes Paternal Uncle     Social History:  Social History   Socioeconomic History   Marital status: Single    Spouse name: Not on file   Number of children: 0   Years of education: Not on file   Highest education level: Bachelor's degree (e.g., BA, AB, BS)  Occupational History   Occupation: student    Comment: [employer]  Tobacco Use   Smoking status: Never   Smokeless tobacco: Never  Vaping Use   Vaping Use: Never used  Substance and Sexual Activity   Alcohol use: No   Drug use: No   Sexual activity: Never  Other Topics Concern   Not on file  Social History Narrative   03/22/17 Lives in dorm, [place], sophomore   Caffeine- coffee, 1 cup 3 x weekly   Social Determinants of Health   Financial Resource Strain: Not on file  Food Insecurity: Not on file  Transportation Needs: Not on file  Physical Activity: Not on file  Stress: Not on file  Social Connections: Not on file    Allergies: No Known Allergies  Metabolic Disorder Labs: Lab Results  Component Value Date   HGBA1C 5.7 (H) 05/11/2021   MPG 100 03/09/2016   Lab Results  Component Value Date   PROLACTIN 5.5 05/07/2016   Lab Results  Component Value Date   CHOL 187 05/11/2021   TRIG 74 05/11/2021   HDL 69 05/11/2021    CHOLHDL 2.4 03/09/2016   VLDL 15 03/09/2016   LDLCALC 104 (H) 05/11/2021   LDLCALC 122 (H) 07/25/2020   Lab Results  Component Value Date   TSH 1.510 07/25/2020   TSH 0.77 05/07/2016    Therapeutic Level Labs: No results found for: LITHIUM No results found for: VALPROATE No components found for:  CBMZ  Current Medications: Current Outpatient Medications  Medication Sig Dispense Refill   albuterol (VENTOLIN HFA) 108 (90 Base) MCG/ACT inhaler Inhale 2 puffs into the lungs every 4 hours as needed to treat wheezes, cough, shortness of breath 1 each 1   ARIPiprazole (ABILIFY) 5 MG tablet Take 1 tablet (5 mg total) by mouth daily. 30 tablet 0   busPIRone (BUSPAR) 7.5 MG tablet Take 1 tablet (7.5 mg total) by mouth 3 (three) times daily as needed (anxiety). 90 tablet 0   cetirizine (ZYRTEC) 10 MG tablet Take 1 tablet (10 mg total) by mouth daily. 90 tablet 0   escitalopram (LEXAPRO) 20 MG tablet Take 1 tablet (20 mg total) by mouth daily. 90 tablet 0   hydrOXYzine (VISTARIL) 25 MG capsule Take 1 capsule (25 mg total) by mouth 3 (three) times daily as needed for anxiety. 60 capsule 0   metFORMIN (GLUCOPHAGE) 500 MG tablet Take 1 tablet (500 mg total) by mouth daily with breakfast. (Patient not taking: Reported on 05/15/2021) 30 tablet 0   Vitamin D, Ergocalciferol, (DRISDOL) 1.25 MG (50000 UNIT) CAPS capsule Take 1 capsule (50,000 Units total) by mouth every 7 (seven) days. 4 capsule 0   No current facility-administered medications for this visit.     Musculoskeletal: Strength & Muscle Tone: within normal limits Gait & Station: normal Patient leans: N/A  Psychiatric Specialty Exam: Review of Systems  Last menstrual period 05/03/2021.There is no height or weight on file to calculate BMI.  General Appearance: Casual  Eye Contact:  Good  Speech:  Clear and Coherent  Volume:  Normal  Mood:  Anxious and Depressed  Affect:  Congruent  Thought Process:  Coherent  Orientation:  Full  (Time, Place, and Person)  Thought Content: Logical   Suicidal Thoughts:  No  Homicidal Thoughts:  No  Memory:  Immediate;   Good Recent;   Good  Judgement:  Good  Insight:  Good  Psychomotor Activity:  Normal  Concentration:  Concentration: Good  Recall:  Good  Fund of Knowledge: Good  Language: Good  Akathisia:  No  Handed:  Right  AIMS (if indicated): done  Assets:  Communication Skills Desire for Improvement Resilience Social Support  ADL's:  Intact  Cognition: WNL  Sleep:  Good   Screenings: GAD-7    Garment/textile technologist Visit from  07/05/2017 in [place] [facility] from 05/17/2017 in [place] [facility] Office Visit from 03/07/2017 in [place] [facility] from 12/31/2016 in [place] [facility] from 11/29/2016 in [place] [facility]  Total GAD-7 Score 3 20 7 3 6       PHQ2-9    Flowsheet Row Counselor from 04/24/2021 in [facility] Counselor from 04/18/2021 in [facility] PARTIAL [facility]OSPITALIZATION PROGRAM ED from 04/11/2021 in [facility] Office Visit from 07/25/2020 in [facility] Office Visit from 07/05/2017 in [place] [facility]  PHQ-2 Total Score 6 6 4 3  0  PHQ-9 Total Score 21 23 18 12 2       Flowsheet Row Counselor from 04/24/2021 in [facility] PARTIAL HOSPITALIZATION PROGRAM Counselor from 04/18/2021 in BEHAVIORAL HEALTH PARTIAL HOSPITALIZATION PROGRAM ED from 04/11/2021 in [facility]  C-SSRS RISK CATEGORY Error: Question 6 not populated No Risk Low Risk        Assessment and Plan:  Patient to continue partial  hospitalization programming Was initiated on Abilify 5 mg daily, she is to continue Lexapro 10 mg and hydroxyzine 25 mg p.o. 3 times daily  Treatment plan was reviewed and agreed upon by NP 04/20/2021 and patient Kristin Blanchard need for continued group services   [facility], NP 05/16/2021, 12:49 PM

## 2021-05-08 ENCOUNTER — Other Ambulatory Visit (HOSPITAL_COMMUNITY): Payer: 59 | Admitting: Occupational Therapy

## 2021-05-08 ENCOUNTER — Encounter (HOSPITAL_COMMUNITY): Payer: Self-pay

## 2021-05-08 ENCOUNTER — Other Ambulatory Visit: Payer: Self-pay

## 2021-05-08 ENCOUNTER — Other Ambulatory Visit (HOSPITAL_COMMUNITY): Payer: 59 | Admitting: Licensed Clinical Social Worker

## 2021-05-08 DIAGNOSIS — F332 Major depressive disorder, recurrent severe without psychotic features: Secondary | ICD-10-CM | POA: Diagnosis not present

## 2021-05-08 DIAGNOSIS — R41844 Frontal lobe and executive function deficit: Secondary | ICD-10-CM

## 2021-05-08 DIAGNOSIS — R4589 Other symptoms and signs involving emotional state: Secondary | ICD-10-CM

## 2021-05-08 NOTE — Therapy (Signed)
[facility] PARTIAL HOSPITALIZATION PROGRAM [address] [place], [place], [address] Phone: [phone]   Fax:  [phone] Virtual Visit via Video Note  I connected with Kristin Blanchard on 05/08/21 at  11:00 AM EST by a video enabled telemedicine application and verified that I am speaking with the correct person using two identifiers.  Location: Patient: Patient Home Provider: Home Office   I discussed the limitations of evaluation and management by telemedicine and the availability of in person appointments. The patient expressed understanding and agreed to proceed.   I discussed the assessment and treatment plan with the patient. The patient was provided an opportunity to ask questions and all were answered. The patient agreed with the plan and demonstrated an understanding of the instructions.   The patient was advised to call back or seek an in-person evaluation if the symptoms worsen or if the condition fails to improve as anticipated.  I provided 65 minutes of non-face-to-face time during this encounter.   Donne Hazel, OT   Occupational Therapy Treatment  Patient Details  Name: Kristin Blanchard MRN: [id number] Date of Birth: 06/10/1995 Referring Provider (OT): Hillery Jacks   Encounter Date: 05/08/2021   OT End of Session - 05/08/21 1321     Visit Number 8    Number of Visits 20    Date for OT Re-Evaluation 05/17/21    Authorization Type [employer]    OT Start Time 1115    OT Stop Time 1220    OT Time Calculation (min) 65 min    Activity Tolerance Patient tolerated treatment well    Behavior During Therapy WFL for tasks assessed/performed             Past Medical History:  Diagnosis Date   Allergic rhinitis    Anxiety    Asthma    Depression    Headache    PCOS (polycystic ovarian syndrome)    Vitamin D deficiency     Past Surgical History:  Procedure Laterality Date   NO PAST SURGERIES     WISDOM TOOTH EXTRACTION       There were no vitals filed for this visit.   Subjective Assessment - 05/08/21 1320     Currently in Pain? No/denies              OT Education - 05/08/21 1320     Education Details Educated on concept of sensory modulation and self-soothing as coping strategies through use of the eight senses    Person(s) Educated Patient    Methods Explanation;Handout    Comprehension Verbalized understanding              OT Short Term Goals - 04/20/21 1232       OT SHORT TERM GOAL #1   Title Pt will actively engage in OT group sessions throughout duration of PHP programming, in order to promote daily structure, social engagement, and opportunities to develop and utilize adaptive strategies to maximize functional performance in preparation for safe transition and integration back into school, work, and the community.    Status On-going      OT SHORT TERM GOAL #2   Title Pt will practice and identify 1-3 adaptive coping strategies she can utilize, in order to safely manage increased depression/anxiety, with min cues, in preparation for safe and healthy reintegration back into the community at discharge.    Status On-going      OT SHORT TERM GOAL #3   Title Pt will demonstrate  improved ability to communicate feelings/needs/wants, as evidenced by, active participation in OT sessions, throughout duration of PHP programming, in order to safely transition back into the community at discharge.    Status On-going           Group Session:  S: "yoga, listening to the rain, taking a shower, and thunderstorms"  O: Today's group session focused on topic of sensory modulation and self-soothing through use of the 8 senses. Discussion introduced the concept of sensory modulation and integration, focusing on how we can utilize our body and it's senses to self-soothe or cope, when we are experiencing an over or under-whelming sensation or feeling. Group members were introduced to a sensory diet  checklist as a helpful tool/resource that can be utilized to identify what activities and strategies we prefer and do not prefer based upon our response to different stimulus. The concept of alerting vs calming activities was also introduced to understand how to counteract how we are feeling (Example: when we are feeling overwhelmed/stressed, engage in something calming. When we are feeling depressed/low energy, engage in something alerting). Group members engaged actively in discussion sharing their own personal sensory likes/dislikes.    A: Kristin Blanchard was somewhat active in her participation of discussion and activity, engaging spontaneously in discussion and sharing activities that she engages in to self-soothe. Pt did not present with her camera on, however engaged verbally and shared the following activities of benefit including "yoga, listening to the rain, taking a shower, and thunderstorms". Pt proved receptive to additional strategies and education on self-soothing.   P: Continue to attend PHP OT group sessions 5x week for 1 weeks to promote daily structure, social engagement, and opportunities to develop and utilize adaptive strategies to maximize functional performance in preparation for safe transition and integration back into school, work, and the community. Plan to address topic of self-care in next OT group session.   Plan - 05/08/21 1321     Occupational performance deficits (Please refer to evaluation for details): ADL's;IADL's;Rest and Sleep;Education;Work;Leisure;Social Participation    Body Structure / Function / Physical Skills ADL    Cognitive Skills Attention;Emotional;Energy/Drive;Learn;Memory;Perception;Problem Solve;Safety Awareness;Temperament/Personality;Thought;Understand    Psychosocial Skills Coping Strategies;Environmental  Adaptations;Habits;Interpersonal Interaction;Routines and Behaviors             Patient will benefit from skilled therapeutic intervention in order  to improve the following deficits and impairments:   Body Structure / Function / Physical Skills: ADL Cognitive Skills: Attention, Emotional, Energy/Drive, Learn, Memory, Perception, Problem Solve, Safety Awareness, Temperament/Personality, Thought, Understand Psychosocial Skills: Coping Strategies, Environmental  Adaptations, Habits, Interpersonal Interaction, Routines and Behaviors   Visit Diagnosis: Difficulty coping  Frontal lobe and executive function deficit  Severe episode of recurrent major depressive disorder, without psychotic features [facility])    Problem List Patient Active Problem List   Diagnosis Date Noted   Severe episode of recurrent major depressive disorder, without psychotic features (HCC)    Adjustment disorder with mixed anxiety and depressed mood 05/31/2017   Patellar pain, right 12/10/2016   Irregular menses 05/07/2016   Nonintractable headache 05/07/2016   Vitamin D deficiency 08/24/2015   Obesity 10/22/2014   Asthma, chronic 02/09/2014   Environmental allergies 02/09/2014    05/08/2021  Donne Hazel, MOT, OTR/L  05/08/2021, 1:21 PM  [facility] PARTIAL HOSPITALIZATION PROGRAM [address] [place], [place], [address] Phone: [phone]   Fax:  [phone]  Name: Kristin Blanchard MRN: [id number] Date of Birth: [birth year]

## 2021-05-09 ENCOUNTER — Other Ambulatory Visit (HOSPITAL_COMMUNITY): Payer: 59

## 2021-05-10 ENCOUNTER — Other Ambulatory Visit: Payer: Self-pay

## 2021-05-10 ENCOUNTER — Encounter (HOSPITAL_COMMUNITY): Payer: Self-pay

## 2021-05-10 ENCOUNTER — Other Ambulatory Visit (HOSPITAL_COMMUNITY): Payer: 59 | Admitting: Occupational Therapy

## 2021-05-10 ENCOUNTER — Other Ambulatory Visit (HOSPITAL_COMMUNITY): Payer: 59 | Admitting: Licensed Clinical Social Worker

## 2021-05-10 DIAGNOSIS — F332 Major depressive disorder, recurrent severe without psychotic features: Secondary | ICD-10-CM

## 2021-05-10 DIAGNOSIS — R4589 Other symptoms and signs involving emotional state: Secondary | ICD-10-CM

## 2021-05-10 DIAGNOSIS — R41844 Frontal lobe and executive function deficit: Secondary | ICD-10-CM

## 2021-05-10 NOTE — Therapy (Addendum)
[place], [place], [address] Phone: [phone]   Fax:  [phone] Virtual Visit via Video Note  I connected with Alyzabeth Pontillo on 05/10/21 at  12:00 PM EST by a video enabled telemedicine application and verified that I am speaking with the correct person using two identifiers.  Location: Patient: Patient Home Provider: Home Office   I discussed the limitations of evaluation and management by telemedicine and the availability of in person appointments. The patient expressed understanding and agreed to proceed.   I discussed the assessment and treatment plan with the patient. The patient was provided an opportunity to ask questions and all were answered. The patient agreed with the plan and demonstrated an understanding of the instructions.   The patient was advised to call back or seek an in-person evaluation if the symptoms worsen or if the condition fails to improve as anticipated.  I provided 50 minutes of non-face-to-face time during this encounter.   Ponciano Ort, OT   Occupational Therapy Treatment  Patient Details  Name: Kristin Blanchard MRN: [id number] Date of Birth: Aug 04, 1995 Referring Provider (OT): Ricky Ala   Encounter Date: 05/10/2021   OT End of Session - 05/10/21 1325     Visit Number 9    Number of Visits 20    Date for OT Re-Evaluation 05/17/21    Authorization Type [employer]    OT Start Time 1200    OT Stop Time 1250    OT Time Calculation (min) 50 min    Activity Tolerance Patient tolerated treatment well    Behavior During Therapy WFL for tasks assessed/performed             Past Medical History:  Diagnosis Date   Allergic rhinitis    Anxiety    Asthma    Depression    Headache    PCOS (polycystic ovarian syndrome)    Vitamin D deficiency     Past Surgical History:  Procedure Laterality Date   NO PAST SURGERIES     WISDOM TOOTH EXTRACTION       There were no vitals filed for this visit.   Subjective Assessment - 05/10/21 1325     Currently in Pain? No/denies                                  OT Education - 05/10/21 1325     Education Details Educated on the 5 F's and provided resources/strategies/tips to improve overall health and wellness    Person(s) Educated Patient    Methods Explanation;Handout    Comprehension Verbalized understanding              OT Short Term Goals - 04/20/21 1232       OT SHORT TERM GOAL #1   Title Pt will actively engage in OT group sessions throughout duration of PHP programming, in order to promote daily structure, social engagement, and opportunities to develop and utilize adaptive strategies to maximize functional performance in preparation for safe transition and integration back into school, work, and the community.    Status On-going      OT SHORT TERM GOAL #2   Title Pt will practice and identify 1-3 adaptive coping strategies she can utilize, in order to safely manage increased depression/anxiety, with min cues, in preparation for safe and healthy reintegration back into the community at discharge.    Status  On-going      OT SHORT TERM GOAL #3   Title Pt will demonstrate improved ability to communicate feelings/needs/wants, as evidenced by, active participation in OT sessions, throughout duration of PHP programming, in order to safely transition back into the community at discharge.    Status On-going           Group Session:  S: "I like shopping so I could walk down every aisle to get moving more"  O: Today's group session focused on the topic of health and wellness as it relates to the impact on mental health. Discussion focused on identifying the 5 F's to wellness including Food, Fitness, Fresh air, Fellowship, and Friendship with self and soul. Group members identified areas of wellness that they would like to improve upon and were educated and  offered various resources. Discussion also focused on how the food we eat impacts our mental health, along with the benefits of engaging in physical activity/exercise and getting outside for fresh air. Discussion wrapped up with group members identifying one area of wellness they could improve upon and identified a strategy to do so.    A: Aliece was active in her participation of discussion and activity, sharing that one way she can increase her active movement is when she goes shopping to go down every aisle. She also identified jumping on a trampoline as a strategy to be more active and get fresh air. Appeared both engaged and receptive to education and information received during discussion.   P: Continue to attend PHP OT group sessions 5x week for 2 weeks to promote daily structure, social engagement, and opportunities to develop and utilize adaptive strategies to maximize functional performance in preparation for safe transition and integration back into school, work, and the community.   OCCUPATIONAL THERAPY DISCHARGE SUMMARY  Visits from Start of Care: 9  Current functional level related to goals / functional outcomes: Jalynn met OT goals during her time in the [facility] program and is ready and agreeable for discharge at this time. Recommend follow up with individual therapist and PCP for any ongoing therapy and treatment needs    Remaining deficits: See above    Education / Equipment: See above    Patient agrees to discharge. Patient goals were met. Patient is being discharged due to meeting the stated rehab goals..     Plan - 05/10/21 1325     Occupational performance deficits (Please refer to evaluation for details): ADL's;IADL's;Rest and Sleep;Education;Work;Leisure;Social Participation    Body Structure / Function / Physical Skills ADL    Cognitive Skills Attention;Emotional;Energy/Drive;Learn;Memory;Perception;Problem Solve;Safety Awareness;Temperament/Personality;Thought;Understand     Psychosocial Skills Coping Strategies;Environmental  Adaptations;Habits;Interpersonal Interaction;Routines and Behaviors             Patient will benefit from skilled therapeutic intervention in order to improve the following deficits and impairments:   Body Structure / Function / Physical Skills: ADL Cognitive Skills: Attention, Emotional, Energy/Drive, Learn, Memory, Perception, Problem Solve, Safety Awareness, Temperament/Personality, Thought, Understand Psychosocial Skills: Coping Strategies, Environmental  Adaptations, Habits, Interpersonal Interaction, Routines and Behaviors   Visit Diagnosis: Difficulty coping  Frontal lobe and executive function deficit  Severe episode of recurrent major depressive disorder, without psychotic features [facility])    Problem List Patient Active Problem List   Diagnosis Date Noted   Severe episode of recurrent major depressive disorder, without psychotic features (HCC)    Adjustment disorder with mixed anxiety and depressed mood 05/31/2017   Patellar pain, right 12/10/2016   Irregular menses 05/07/2016  Nonintractable headache 05/07/2016   Vitamin D deficiency 08/24/2015   Obesity 10/22/2014   Asthma, chronic 02/09/2014   Environmental allergies 02/09/2014    05/10/2021  Ponciano Ort, MOT, OTR/L  05/10/2021, 1:26 PM  [place], [place], [address] Phone: [phone]   Fax:  [phone]  Name: Camaryn Lumbert MRN: [id number] Date of Birth: [birth year]

## 2021-05-11 ENCOUNTER — Other Ambulatory Visit: Payer: Self-pay

## 2021-05-11 ENCOUNTER — Ambulatory Visit (INDEPENDENT_AMBULATORY_CARE_PROVIDER_SITE_OTHER): Payer: 59 | Admitting: Family Medicine

## 2021-05-11 ENCOUNTER — Other Ambulatory Visit (HOSPITAL_COMMUNITY): Payer: 59

## 2021-05-11 ENCOUNTER — Encounter (INDEPENDENT_AMBULATORY_CARE_PROVIDER_SITE_OTHER): Payer: Self-pay | Admitting: Family Medicine

## 2021-05-11 VITALS — BP 123/85 | HR 83 | Temp 97.5°F | Ht 60.0 in | Wt 261.0 lb

## 2021-05-11 DIAGNOSIS — E282 Polycystic ovarian syndrome: Secondary | ICD-10-CM | POA: Diagnosis not present

## 2021-05-11 DIAGNOSIS — F32A Depression, unspecified: Secondary | ICD-10-CM

## 2021-05-11 DIAGNOSIS — E559 Vitamin D deficiency, unspecified: Secondary | ICD-10-CM

## 2021-05-11 DIAGNOSIS — E782 Mixed hyperlipidemia: Secondary | ICD-10-CM

## 2021-05-11 DIAGNOSIS — Z6841 Body Mass Index (BMI) 40.0 and over, adult: Secondary | ICD-10-CM

## 2021-05-11 DIAGNOSIS — F419 Anxiety disorder, unspecified: Secondary | ICD-10-CM | POA: Diagnosis not present

## 2021-05-11 MED ORDER — METFORMIN HCL 500 MG PO TABS: 500.0000 mg | ORAL_TABLET | Freq: Every day | ORAL | 0 refills | Status: DC

## 2021-05-11 MED ORDER — VITAMIN D (ERGOCALCIFEROL) 1.25 MG (50000 UNIT) PO CAPS
50000.0000 [IU] | ORAL_CAPSULE | ORAL | 0 refills | Status: DC
Start: 2021-05-11 — End: 2021-06-08

## 2021-05-11 MED ORDER — ESCITALOPRAM OXALATE 20 MG PO TABS: 20.0000 mg | ORAL_TABLET | Freq: Every day | ORAL | 0 refills | Status: DC

## 2021-05-11 NOTE — Progress Notes (Signed)
Chief Complaint:   OBESITY Kristin Blanchard is here to discuss her progress with her obesity treatment plan along with follow-up of her obesity related diagnoses. Kristin Blanchard is on keeping a food journal and adhering to recommended goals of 1350 calories and 85 grams protein and states she is following her eating plan approximately 40% of the time. Kristin Blanchard states she is doing yoga 60 minutes 1 times per week.  Today's visit was #: 13 Starting weight: 244 lbs Starting date: 07/25/2020 Today's weight: 261 lbs Today's date: 05/11/2021 Total lbs lost to date: 0 Total lbs lost since last in-office visit: 0  Interim History: Kristin Blanchard has been focused on increasing food intake and eating 2 meals a day and also drinking more Christmas/holiday coffee beverages. She has sweet cravings. Pt has started yoga class. Example of meals is pasta with chicken, spinach and broccoli, or chicken, vegetable, and rice. Pt's friend is getting married in the summer and wants to really adhere to plan.  Subjective:   1. PCOS (polycystic ovarian syndrome) Kristin Blanchard reports a significant increase in acne and irregular periods. Her last insulin level was 23.1.  2. Mixed hyperlipidemia Pt's last LDL was 122, HDL 71, and triglycerides 66.  3. Vitamin D deficiency Pt denies nausea, vomiting, and muscle weakness but notes fatigue. She is on prescription Vit D.  4. Anxiety and depression Kristin Blanchard is on Lexapro 20 mg and Abilify for the last 2 weeks. She has Buspar and Vistaril.  Assessment/Plan:   1. PCOS (polycystic ovarian syndrome) Start Metformin 500 mg daily. Check labs today.  Start- metFORMIN (GLUCOPHAGE) 500 MG tablet; Take 1 tablet (500 mg total) by mouth daily with breakfast.  Dispense: 30 tablet; Refill: 0  - Hemoglobin A1c - Insulin, random - Comprehensive metabolic panel  2. Mixed hyperlipidemia Cardiovascular risk and specific lipid/LDL goals reviewed.  We discussed several lifestyle modifications today and Sayaka will continue  to work on diet, exercise and weight loss efforts. Orders and follow up as documented in patient record.   Counseling Intensive lifestyle modifications are the first line treatment for this issue. Dietary changes: Increase soluble fiber. Decrease simple carbohydrates. Exercise changes: Moderate to vigorous-intensity aerobic activity 150 minutes per week if tolerated. Lipid-lowering medications: see documented in medical record. Check labs today.  - Lipid Panel With LDL/HDL Ratio  3. Vitamin D deficiency Low Vitamin D level contributes to fatigue and are associated with obesity, breast, and colon cancer. She agrees to continue to take prescription Vitamin D @50 ,000 IU every week and will follow-up for routine testing of Vitamin D, at least 2-3 times per year to avoid over-replacement. Check labs today.  Refill- Vitamin D, Ergocalciferol, (DRISDOL) 1.25 MG (50000 UNIT) CAPS capsule; Take 1 capsule (50,000 Units total) by mouth every 7 (seven) days.  Dispense: 4 capsule; Refill: 0  - VITAMIN D 25 Hydroxy (Vit-D Deficiency, Fractures)  4. Anxiety and depression Behavior modification techniques were discussed today to help Kristin Blanchard deal with her anxiety.  Orders and follow up as documented in patient record.   Refill- escitalopram (LEXAPRO) 20 MG tablet; Take 1 tablet (20 mg total) by mouth daily.  Dispense: 90 tablet; Refill: 0  5. Obesity with current BMI of 51.1  Kristin Blanchard is currently in the action stage of change. As such, her goal is to continue with weight loss efforts. She has agreed to keeping a food journal and adhering to recommended goals of 1350 calories and 85+ grams protein.   Exercise goals:  As is  Kristin Blanchard  modification strategies: increasing lean protein intake, no skipping meals, emotional eating strategies, holiday eating strategies , and planning for success.  Kristin Blanchard has agreed to follow-up with our clinic in 3-4 weeks. She was informed of the importance of frequent follow-up  visits to maximize her success with intensive lifestyle modifications for her multiple health conditions.   Kristin Blanchard was informed we would discuss her lab results at her next visit unless there is a critical issue that needs to be addressed sooner. Kristin Blanchard agreed to keep her next visit at the agreed upon time to discuss these results.  Objective:   Blood pressure 123/85, pulse 83, temperature (!) 97.5 F (36.4 C), height 5' (1.524 m), weight 261 lb (118.4 kg), last menstrual period 05/03/2021, SpO2 100 %. Body mass index is 50.97 kg/m.  General: Cooperative, alert, well developed, in no acute distress. HEENT: Conjunctivae and lids unremarkable. Cardiovascular: Regular rhythm.  Lungs: Normal work of breathing. Neurologic: No focal deficits.   Lab Results  Component Value Date   CREATININE 0.65 07/25/2020   BUN 10 07/25/2020   NA 142 07/25/2020   K 5.0 07/25/2020   CL 105 07/25/2020   CO2 22 07/25/2020   Lab Results  Component Value Date   ALT 18 07/25/2020   AST 19 07/25/2020   ALKPHOS 92 07/25/2020   BILITOT <0.2 07/25/2020   Lab Results  Component Value Date   HGBA1C 5.5 07/25/2020   HGBA1C 5.2 08/10/2016   HGBA1C 5.1 03/09/2016   HGBA1C 5.2 11/15/2014   Lab Results  Component Value Date   INSULIN 23.1 07/25/2020   Lab Results  Component Value Date   TSH 1.510 07/25/2020   Lab Results  Component Value Date   CHOL 205 (H) 07/25/2020   HDL 71 07/25/2020   LDLCALC 122 (H) 07/25/2020   TRIG 66 07/25/2020   CHOLHDL 2.4 03/09/2016   Lab Results  Component Value Date   VD25OH 17.0 (L) 07/25/2020   VD25OH 14 (L) 05/07/2016   VD25OH 14 (L) 03/09/2016   Lab Results  Component Value Date   WBC 4.0 07/25/2020   HGB 12.6 07/25/2020   HCT 38.9 07/25/2020   MCV 90 07/25/2020   PLT 361 07/25/2020    Attestation Statements:   Reviewed by clinician on day of visit: allergies, medications, problem list, medical history, surgical history, family history, social history,  and previous encounter notes.  Edmund Hilda, CMA, am acting as transcriptionist for Reuben Likes, MD.  I have reviewed the above documentation for accuracy and completeness, and I agree with the above. - Reuben Likes, MD

## 2021-05-12 ENCOUNTER — Other Ambulatory Visit (HOSPITAL_COMMUNITY): Payer: 59

## 2021-05-12 LAB — COMPREHENSIVE METABOLIC PANEL
ALT: 18 IU/L (ref 0–32)
AST: 17 IU/L (ref 0–40)
Albumin/Globulin Ratio: 1.6 (ref 1.2–2.2)
Albumin: 4.1 g/dL (ref 3.9–5.0)
Alkaline Phosphatase: 89 IU/L (ref 44–121)
BUN/Creatinine Ratio: 12 (ref 9–23)
BUN: 8 mg/dL (ref 6–20)
Bilirubin Total: <0.2 mg/dL (ref 0.0–1.2)
CO2: 23 mmol/L (ref 20–29)
Calcium: 9 mg/dL (ref 8.7–10.2)
Chloride: 102 mmol/L (ref 96–106)
Creatinine, Ser: 0.69 mg/dL (ref 0.57–1.00)
Globulin, Total: 2.6 g/dL (ref 1.5–4.5)
Glucose: 94 mg/dL (ref 70–99)
Potassium: 4.5 mmol/L (ref 3.5–5.2)
Sodium: 140 mmol/L (ref 134–144)
Total Protein: 6.7 g/dL (ref 6.0–8.5)
eGFR: 125 mL/min/{1.73_m2} (ref >59)

## 2021-05-12 LAB — VITAMIN D 25 HYDROXY (VIT D DEFICIENCY, FRACTURES): Vit D, 25-Hydroxy: 29.9 ng/mL — ABNORMAL LOW (ref 30.0–100.0)

## 2021-05-12 LAB — LIPID PANEL WITH LDL/HDL RATIO
Cholesterol, Total: 187 mg/dL (ref 100–199)
HDL: 69 mg/dL (ref >39)
LDL Chol Calc (NIH): 104 mg/dL — ABNORMAL HIGH (ref 0–99)
LDL/HDL Ratio: 1.5 ratio (ref 0.0–3.2)
Triglycerides: 74 mg/dL (ref 0–149)
VLDL Cholesterol Cal: 14 mg/dL (ref 5–40)

## 2021-05-12 LAB — INSULIN, RANDOM: INSULIN: 20.2 u[IU]/mL (ref 2.6–24.9)

## 2021-05-12 LAB — HEMOGLOBIN A1C
Est. average glucose Bld gHb Est-mCnc: 117 mg/dL
Hgb A1c MFr Bld: 5.7 % — ABNORMAL HIGH (ref 4.8–5.6)

## 2021-05-15 ENCOUNTER — Other Ambulatory Visit (HOSPITAL_COMMUNITY): Payer: 59 | Admitting: Licensed Clinical Social Worker

## 2021-05-15 ENCOUNTER — Telehealth (INDEPENDENT_AMBULATORY_CARE_PROVIDER_SITE_OTHER): Payer: 59 | Admitting: Psychology

## 2021-05-15 ENCOUNTER — Other Ambulatory Visit: Payer: Self-pay

## 2021-05-15 ENCOUNTER — Other Ambulatory Visit (HOSPITAL_COMMUNITY): Payer: 59

## 2021-05-15 DIAGNOSIS — F332 Major depressive disorder, recurrent severe without psychotic features: Secondary | ICD-10-CM

## 2021-05-16 ENCOUNTER — Encounter (HOSPITAL_COMMUNITY): Payer: Self-pay | Admitting: Family

## 2021-05-16 ENCOUNTER — Other Ambulatory Visit (HOSPITAL_COMMUNITY): Payer: 59 | Admitting: Licensed Clinical Social Worker

## 2021-05-16 ENCOUNTER — Other Ambulatory Visit: Payer: Self-pay

## 2021-05-16 ENCOUNTER — Other Ambulatory Visit (HOSPITAL_COMMUNITY): Payer: 59

## 2021-05-16 DIAGNOSIS — F32A Depression, unspecified: Secondary | ICD-10-CM

## 2021-05-16 DIAGNOSIS — F419 Anxiety disorder, unspecified: Secondary | ICD-10-CM

## 2021-05-16 DIAGNOSIS — F332 Major depressive disorder, recurrent severe without psychotic features: Secondary | ICD-10-CM

## 2021-05-16 MED ORDER — ESCITALOPRAM OXALATE 20 MG PO TABS: 20.0000 mg | ORAL_TABLET | Freq: Every day | ORAL | 0 refills | Status: DC

## 2021-05-16 MED ORDER — HYDROXYZINE PAMOATE 25 MG PO CAPS
25.0000 mg | ORAL_CAPSULE | Freq: Three times a day (TID) | ORAL | 0 refills | Status: DC | PRN
Start: 2021-05-16 — End: 2021-08-30

## 2021-05-16 MED ORDER — ARIPIPRAZOLE 5 MG PO TABS: 5.0000 mg | ORAL_TABLET | Freq: Every day | ORAL | 0 refills | Status: DC

## 2021-05-16 MED ORDER — BUSPIRONE HCL 7.5 MG PO TABS: 7.5000 mg | ORAL_TABLET | Freq: Three times a day (TID) | ORAL | 0 refills | Status: AC | PRN

## 2021-05-16 NOTE — Progress Notes (Signed)
Medication was refilled.

## 2021-05-16 NOTE — Progress Notes (Signed)
Spoke with patient via Webex video call, used 2 identifiers to correctly identify patient. States that groups are going good, her depression is getting better. Had a friend visit from [place] and had a great visit but was sad to see her go. She is wanting to lose weight and her physician started her on Abilify. She is being discharged this week from [facility]. On scale 1-10 as 10 being worst she rates depression at 6 and anxiety at 2. Denies SI/HI or AV hallucinations. PHQ9=10. No issues or complaints. No side effects from medications.

## 2021-05-16 NOTE — Progress Notes (Signed)
Virtual Visit via Video Note  I connected with Kristin Blanchard on 05/16/21 at  9:00 AM EST by a video enabled telemedicine application and verified that I am speaking with the correct person using two identifiers.  Location: Patient: Home Provider: Office    I discussed the limitations of evaluation and management by telemedicine and the availability of in person appointments. The patient expressed understanding and agreed to proceed.   I discussed the assessment and treatment plan with the patient. The patient was provided an opportunity to ask questions and all were answered. The patient agreed with the plan and demonstrated an understanding of the instructions.   The patient was advised to call back or seek an in-person evaluation if the symptoms worsen or if the condition fails to improve as anticipated.  I provided 15 minutes of non-face-to-face time during this encounter.   Kristin Rack, NP   [facility]  Partial Outpatient Program Discharge Summary  Kristin Blanchard [id number]  Admission date: 04/19/2021 Discharge date: 05/16/2021  Reason for admission: Per admission assessment note-  Kristin Blanchard is a 25 y.o.  female presents with worsening depression and passive suicidal ideations.  Reports she recently graduated and received a degree in political studies.  States she is struggled to obtain her undergraduate degree slightly discouraged to attend graduate school. States "I do not have a full-time job,I just though things will be different when I graduated."  Currently she is denying suicidal or homicidal ideations.  Denies auditory or visual hallucinations.  Denied history of physical sexual abuse in the past.  Denied illicit drug use or substance abuse history.   Progress in Program Toward Treatment Goals: Ongoing, patient attended and participated with daily group session with active and engaged participation.  Denying suicidal or homicidal ideations.  Denies auditory  visual hallucinations.  States learning multiple coping skills throughout this admission.  We will make medication refills available until patient is able to follow-up with primary care provider and/or psychiatrist.  She has been provided with additional outpatient resources for intensive outpatient programming however declined at this time.  Support,encouragement and  reassurance was provided.  Progress (rationale):  patient reported has a follow-up appointment 06/2021 with therapist   Take all medications as prescribed. Keep all follow-up appointments as scheduled.  Do not consume alcohol or use illegal drugs while on prescription medications. Report any adverse effects from your medications to your primary care provider promptly.  In the event of recurrent symptoms or worsening symptoms, call 911, a crisis hotline, or go to the nearest emergency department for evaluation.    Kristin Jacks NP  05/16/2021

## 2021-05-17 ENCOUNTER — Ambulatory Visit (HOSPITAL_COMMUNITY): Payer: 59

## 2021-05-17 ENCOUNTER — Other Ambulatory Visit (HOSPITAL_COMMUNITY): Payer: 59

## 2021-05-18 ENCOUNTER — Ambulatory Visit (HOSPITAL_COMMUNITY): Payer: 59

## 2021-05-18 ENCOUNTER — Other Ambulatory Visit (HOSPITAL_COMMUNITY): Payer: 59

## 2021-05-19 ENCOUNTER — Other Ambulatory Visit (HOSPITAL_COMMUNITY): Payer: 59

## 2021-05-19 ENCOUNTER — Ambulatory Visit (HOSPITAL_COMMUNITY): Payer: 59

## 2021-05-22 ENCOUNTER — Other Ambulatory Visit (HOSPITAL_COMMUNITY): Payer: 59

## 2021-05-22 ENCOUNTER — Ambulatory Visit (HOSPITAL_COMMUNITY): Payer: 59

## 2021-05-23 ENCOUNTER — Other Ambulatory Visit (HOSPITAL_COMMUNITY): Payer: 59

## 2021-05-23 ENCOUNTER — Ambulatory Visit (HOSPITAL_COMMUNITY): Payer: 59

## 2021-05-24 ENCOUNTER — Other Ambulatory Visit (HOSPITAL_COMMUNITY): Payer: 59

## 2021-05-24 ENCOUNTER — Ambulatory Visit (HOSPITAL_COMMUNITY): Payer: 59

## 2021-05-25 ENCOUNTER — Other Ambulatory Visit (HOSPITAL_COMMUNITY): Payer: 59

## 2021-05-25 ENCOUNTER — Ambulatory Visit (HOSPITAL_COMMUNITY): Payer: 59

## 2021-05-26 ENCOUNTER — Ambulatory Visit (HOSPITAL_COMMUNITY): Payer: 59

## 2021-05-26 ENCOUNTER — Other Ambulatory Visit (HOSPITAL_COMMUNITY): Payer: 59

## 2021-05-31 NOTE — Psych (Signed)
Virtual Visit via Video Note  I connected with Kristin Blanchard on 05/05/21 at  9:00 AM EST by a video enabled telemedicine application and verified that I am speaking with the correct person using two identifiers.  Location: Patient: Home Provider: Clinical Home Office   I discussed the limitations of evaluation and management by telemedicine and the availability of in person appointments. The patient expressed understanding and agreed to proceed.  Follow Up Instructions:    I discussed the assessment and treatment plan with the patient. The patient was provided an opportunity to ask questions and all were answered. The patient agreed with the plan and demonstrated an understanding of the instructions.   The patient was advised to call back or seek an in-person evaluation if the symptoms worsen or if the condition fails to improve as anticipated.  I provided 120 minutes of non-face-to-face time during this encounter.   Quinn Axe, [facility]    [facility] PHP THERAPIST PROGRESS NOTE  Kristin Blanchard [id number]  Session Time: 9-10  Participation Level: Active  Behavioral Response: CasualAlertAnxious and Depressed  Type of Therapy: Group Therapy  Treatment Goals addressed: Coping  Interventions: CBT, DBT, Solution Focused, Strength-based, Supportive, and Reframing  Summary: Clinician led check-in regarding current stressors and situation. Clinician utilized active listening and empathetic response and validated patient emotions. Clinician facilitated processing group on pertinent issues.  Therapist Response: Kristin Blanchard is a 25 y.o. female who presents with depression and anxiety symptoms. Patient arrived within time allowed and reports that she is feeling good" Patient rates her mood at an 8 on a scale of 1-10 with 10 being great. Pt reports having a busy and productive afternoon. Pt reports struggling to find alone time due to a friend staying with her. Pt able to process. Pt  engaged in discussion.   Session Time: 10:00 - 11:00   Participation Level: Active   Behavioral Response: CasualAlertDepressed   Type of Therapy: Group Therapy   Treatment Goals addressed: Coping   Interventions: CBT, DBT, Supportive and Reframing   Summary: [name] led discussion on grounding techniques. Pt's able to practice different techniques.    Therapist Response:  Pt engaged in discussion and reports they will use when experiencing depersonalization.   **Pt left group at 11 due to another appointment.  Suicidal/Homicidal: Nowithout intent/plan  Plan: Pt will continue in PHP while working to decrease depression symptoms, increase emotion regulation, and increase ability to manage symptoms in a healthy manner.     Diagnosis: MDD (major depressive disorder), recurrent episode, moderate (HCC) [F33.1]    1. MDD (major depressive disorder), recurrent episode, moderate (HCC)   2. Severe episode of recurrent major depressive disorder, without psychotic features [facility])       Quinn Axe, [facility] 05/31/2021

## 2021-06-07 ENCOUNTER — Ambulatory Visit (INDEPENDENT_AMBULATORY_CARE_PROVIDER_SITE_OTHER): Payer: 59 | Admitting: Psychology

## 2021-06-07 ENCOUNTER — Encounter: Payer: Self-pay | Admitting: Psychology

## 2021-06-07 DIAGNOSIS — F331 Major depressive disorder, recurrent, moderate: Secondary | ICD-10-CM | POA: Diagnosis not present

## 2021-06-07 DIAGNOSIS — F4322 Adjustment disorder with anxiety: Secondary | ICD-10-CM | POA: Diagnosis not present

## 2021-06-07 NOTE — Progress Notes (Addendum)
[place] Counselor Initial Adult Exam  Name: Kristin Blanchard Date: 06/07/2021 MRN: [id number] DOB: March 05, 1996 PCP: Patient, No Pcp Per (Inactive)  Time spent: 4:00 - 5:00pm  Guardian/Payee:  Self    Paperwork requested: No   Met with patient for initial interview.  Patient was at home due to COVID-19 restrictions and session was conducted from therapist's office via video conferencing.  Patient verbally consented to telehealth.  Reason for Visit /Presenting Problem:  Patient referred by Dr. Glennie Isle for Adhd Consult.  Patient reports always having staying focused while studying.  Her mind jumps from topic to topic.  Not able to finish a book.  Had difficulty with taking tests, trying to finish the exam as quickly as possible and making several mistakes.  Has frequent forgetfulness.   Mental Status Exam: Appearance:   Neat and Well Groomed     Behavior:  Appropriate  Motor:  Normal  Speech/Language:   Clear and Coherent and Normal Rate  Affect:  Depressed  Mood:  depressed  Thought process:  normal  Thought content:    WNL  Sensory/Perceptual disturbances:    WNL  Orientation:  oriented to person, place, time/date, and situation  Attention:  Good  Concentration:  Good  Memory:  WNL for all  Fund of knowledge:   Good  Insight:    Good  Judgment:   Fair and Hesitant   Impulse Control:  Good   Reported Symptoms:  Sleeps well now with medication.  Otherwise wakes during the night.  Trouble falling and staying asleep since childhood.  No changes in appetite.  Always had a smaller appetite.  Energy currently slow.  Currently experiencing depression.  Gets bursts of energy at night when not taking sleep medication.  Been depressed since November 2022.  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  Has had episodes of depression before but as intense as this time.  Some helplessness and hopelessness.  No thoughts of self harm.  No recent anxiety/panic - Last time 27-Mar-2021.  Fears rejection and death to loved ones (seems irrational).  Prepares for people to die rather than enjoying the moment with them.  Worries in general and has generalized anxiety.  Some social anxiety in large unfamiliar groups.  No obsessive thought since started taking anxiety medication about 3-4 years ago.  No compulsive behavior.  Trouble paying attention since school age.  Would zone out in class.  Easily distracted. Frequent losing and forgetting.  Poor organization.  Restless-hands or mind have to be active.  Verbally impulsive and shops impulsively.   No mania - constantly worries about doing activities but rarely does them.         Risk Assessment: Danger to Self:  No Self-injurious Behavior: No Danger to Others: No Duty to Warn:no Physical Aggression / Violence:No  Access to Firearms a concern: No  Gang Involvement:No   Substance Abuse History: Current substance abuse: No     Past Psychiatric History:   Previous psychological history is significant for anxiety and depression Outpatient Providers:Seeing Dr. Glennie Isle for psychotherapy since 03/27/2021.  Has had previous outpatient therapy. History of Psych Hospitalization: No  Psychological Testing:  None    Abuse History:  Victim of: No.,       Report needed: No. Victim of Neglect:No. Perpetrator of  None   Witness / Exposure to Domestic Violence: No   Witness to [employer] Violence:  No  Family History:  Family History  Problem Relation Age of Onset   Other Mother        hypoglycemia   Sleep apnea Mother    Obesity Mother    Diabetes Father    Hypertension Father    Depression Sister    Anxiety disorder Sister    Bipolar disorder Brother    Kidney disease Maternal Aunt    Diabetes Paternal Aunt    Diabetes Paternal Uncle     Living situation: the patient lives with their family - parents, older sister (27) and two younger  siblings (sister 31) and (brother 67).  Gets along adequately with family.  Closest to sisters and mother.     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  Able to get work done as work is project based and can go at her own pace to complete it.  Not well organized but can get tasks done in timely fashion.  Able to focus because has nothing else to do other than work.      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  Struggled academically.  Didn't need to study through high school and had problems once reached college.  Had trouble focusing during class time and became anxious during tests (too quiet), resulting in her rushing through her exams.     Religion/Sprituality/World View: Muslim  Any cultural differences that may affect / interfere with treatment:  [place]   Recreation/Hobbies: Pottery, Yoga, [place] Pop Music  Stressors: Other: Life event - transitioning out of college and adapting to adult life.  Trouble regulating spending and managing finances.       Strengths: Struggles to manage stress and avoids taking on too much stress.    Barriers:  Depression and GPA - Patient wants to apply to law school and has low GPA.     Legal History: Pending legal issue / charges: The patient has no significant history of legal issues. History of legal issue / charges:  None  Medical History/Surgical History: reviewed Past Medical History:  Diagnosis Date   Allergic rhinitis    Anxiety    Asthma    Depression    Headache    PCOS (polycystic ovarian syndrome)    Vitamin D deficiency     Past Surgical History:  Procedure  Laterality Date   NO PAST SURGERIES     WISDOM TOOTH EXTRACTION      Medications: Current Outpatient Medications  Medication Sig Dispense Refill   albuterol (VENTOLIN HFA) 108 (90 Base) MCG/ACT inhaler Inhale 2 puffs into the lungs every 4 hours as needed to treat wheezes, cough, shortness of breath 1 each 1   ARIPiprazole (ABILIFY) 5 MG tablet Take 1 tablet (5 mg total) by mouth daily. 30 tablet 0   busPIRone (BUSPAR) 7.5 MG tablet Take 1 tablet (7.5 mg total) by mouth 3 (three) times daily as needed (anxiety). 90 tablet 0   cetirizine (ZYRTEC) 10 MG tablet Take 1 tablet (10 mg total) by mouth daily. 90 tablet 0   escitalopram (LEXAPRO) 20 MG tablet Take 1 tablet (20 mg total) by mouth daily. 90 tablet 0   hydrOXYzine (VISTARIL) 25 MG capsule Take 1  capsule (25 mg total) by mouth 3 (three) times daily as needed for anxiety. 60 capsule 0   metFORMIN (GLUCOPHAGE) 500 MG tablet Take 1 tablet (500 mg total) by mouth daily with breakfast. (Patient not taking: Reported on 05/15/2021) 30 tablet 0   Vitamin D, Ergocalciferol, (DRISDOL) 1.25 MG (50000 UNIT) CAPS capsule Take 1 capsule (50,000 Units total) by mouth every 7 (seven) days. 4 capsule 0   No current facility-administered medications for this visit.  Currently not taking Buspar.    No Known Allergies  Developmental History:  No early delays in milestones Gross motor adequate - no sports or physical activity Fine motor - good hand writing but not artistic.  Writes creatively and started doing pottery.  Good with other fine motor. Speech - typical Self care and independence - good  Social - good.   Diagnoses:  MDD (major depressive disorder), recurrent episode, moderate (HCC)  Adjustment disorder with anxiety  Plan of Care: Patient presents with current depressed mood and anxiety, but reported a history of attention deficits, distractibility, poor organization, physical restlessness and impulsivity beginning during childhood-school  aged years.  Testing recommended to evaluate for ADHD as well as other conditions that may be affecting attention.  Test Battery - Virtual WAIS-IV, BRIEF-A, CNSVS, Adult ADHD, DASS, Adult OCD     Rainey Pines, PhD

## 2021-06-08 ENCOUNTER — Encounter (INDEPENDENT_AMBULATORY_CARE_PROVIDER_SITE_OTHER): Payer: Self-pay | Admitting: Family Medicine

## 2021-06-08 ENCOUNTER — Other Ambulatory Visit: Payer: Self-pay

## 2021-06-08 ENCOUNTER — Ambulatory Visit (INDEPENDENT_AMBULATORY_CARE_PROVIDER_SITE_OTHER): Payer: Self-pay | Admitting: Family Medicine

## 2021-06-08 VITALS — BP 123/83 | HR 76 | Temp 97.8°F | Ht 60.0 in | Wt 266.0 lb

## 2021-06-08 DIAGNOSIS — E559 Vitamin D deficiency, unspecified: Secondary | ICD-10-CM

## 2021-06-08 DIAGNOSIS — Z6841 Body Mass Index (BMI) 40.0 and over, adult: Secondary | ICD-10-CM

## 2021-06-08 DIAGNOSIS — R7303 Prediabetes: Secondary | ICD-10-CM

## 2021-06-08 DIAGNOSIS — J3089 Other allergic rhinitis: Secondary | ICD-10-CM

## 2021-06-08 MED ORDER — OZEMPIC (0.25 OR 0.5 MG/DOSE) 2 MG/1.5ML ~~LOC~~ SOPN: 0.2500 mg | PEN_INJECTOR | SUBCUTANEOUS | 0 refills | Status: DC

## 2021-06-08 MED ORDER — CETIRIZINE HCL 10 MG PO TABS: 10.0000 mg | ORAL_TABLET | Freq: Every day | ORAL | 0 refills | Status: DC

## 2021-06-12 MED ORDER — VITAMIN D (ERGOCALCIFEROL) 1.25 MG (50000 UNIT) PO CAPS: 50000.0000 [IU] | ORAL_CAPSULE | ORAL | 0 refills | Status: DC

## 2021-06-12 NOTE — Progress Notes (Signed)
Chief Complaint:   OBESITY Kristin Blanchard is here to discuss her progress with her obesity treatment plan along with follow-up of her obesity related diagnoses. Kristin Blanchard is on keeping a food journal and adhering to recommended goals of 1350 calories and 85+ grams protein and states she is following her eating plan approximately 30-40% of the time. Kristin Blanchard states she is doing yoga and going to the gym 30-60 minutes 1-2 times per week.  Today's visit was #: 14 Starting weight: 244 lbs Starting date: 07/25/2020 Today's weight: 266 lbs Today's date: 06/08/2021 Total lbs lost to date: 0 Total lbs lost since last in-office visit: 0  Interim History: Kristin Blanchard had a busy December with graduation, vacation, and celebrations. Food wise, she hasn't been as compliant as she wants to be. She took metformin at night when she remembered but would then vomit in the morning. Pt started eating 3 times a day. She feels she does well at breakfast and lunch but struggles with dinner.  Subjective:   1. Prediabetes Pt's last A1c was 5.7 with an insulin level of 20.2. She could not tolerate metformin due to vomiting.  2. Vitamin D deficiency Pt denies nausea, vomiting, and muscle weakness but notes fatigue. She is on prescription Vit D.  3. Non-seasonal allergic rhinitis due to other allergic trigger Kristin Blanchard is on cetirizine with good control of symptoms.  Assessment/Plan:   1. Prediabetes Kristin Blanchard will start Ozempic 0.25 mg as directed and continue to work on weight loss, exercise, and decreasing simple carbohydrates to help decrease the risk of diabetes.   Start- Semaglutide,0.25 or 0.5MG /DOS, (OZEMPIC, 0.25 OR 0.5 MG/DOSE,) 2 MG/1.5ML SOPN; Inject 0.25 mg into the skin once a week.  Dispense: 1.5 mL; Refill: 0  2. Vitamin D deficiency Low Vitamin D level contributes to fatigue and are associated with obesity, breast, and colon cancer. She agrees to continue to take prescription Vitamin D 50,000 IU every week and will  follow-up for routine testing of Vitamin D, at least 2-3 times per year to avoid over-replacement.  Refill Vit D 50K IU/wk, Disp #4, 0 RF  3. Non-seasonal allergic rhinitis due to other allergic trigger Continue current treatment plan.  Refill- cetirizine (ZYRTEC) 10 MG tablet; Take 1 tablet (10 mg total) by mouth daily.  Dispense: 90 tablet; Refill: 0  4. Obesity with current BMI of 52.0  Kristin Blanchard is currently in the action stage of change. As such, her goal is to continue with weight loss efforts. She has agreed to practicing portion control and making smarter food choices, such as increasing vegetables and decreasing simple carbohydrates.   She wants to eat out 1x a week and then try to work at eating 3 meals a day.  Exercise goals: All adults should avoid inactivity. Some physical activity is better than none, and adults who participate in any amount of physical activity gain some health benefits.  Behavioral modification strategies: increasing lean protein intake, meal planning and cooking strategies, keeping healthy foods in the home, planning for success, and keeping a strict food journal.  Kristin Blanchard has agreed to follow-up with our clinic in 3 weeks. She was informed of the importance of frequent follow-up visits to maximize her success with intensive lifestyle modifications for her multiple health conditions.   Objective:   Blood pressure 123/83, pulse 76, temperature 97.8 F (36.6 C), height 5' (1.524 m), weight 266 lb (120.7 kg), last menstrual period 05/03/2021, SpO2 98 %. Body mass index is 51.95 kg/m.  General: Cooperative, alert,  well developed, in no acute distress. HEENT: Conjunctivae and lids unremarkable. Cardiovascular: Regular rhythm.  Lungs: Normal work of breathing. Neurologic: No focal deficits.   Lab Results  Component Value Date   CREATININE 0.69 05/11/2021   BUN 8 05/11/2021   NA 140 05/11/2021   K 4.5 05/11/2021   CL 102 05/11/2021   CO2 23 05/11/2021    Lab Results  Component Value Date   ALT 18 05/11/2021   AST 17 05/11/2021   ALKPHOS 89 05/11/2021   BILITOT <0.2 05/11/2021   Lab Results  Component Value Date   HGBA1C 5.7 (H) 05/11/2021   HGBA1C 5.5 07/25/2020   HGBA1C 5.2 08/10/2016   HGBA1C 5.1 03/09/2016   HGBA1C 5.2 11/15/2014   Lab Results  Component Value Date   INSULIN 20.2 05/11/2021   INSULIN 23.1 07/25/2020   Lab Results  Component Value Date   TSH 1.510 07/25/2020   Lab Results  Component Value Date   CHOL 187 05/11/2021   HDL 69 05/11/2021   LDLCALC 104 (H) 05/11/2021   TRIG 74 05/11/2021   CHOLHDL 2.4 03/09/2016   Lab Results  Component Value Date   VD25OH 29.9 (L) 05/11/2021   VD25OH 17.0 (L) 07/25/2020   VD25OH 14 (L) 05/07/2016   Lab Results  Component Value Date   WBC 4.0 07/25/2020   HGB 12.6 07/25/2020   HCT 38.9 07/25/2020   MCV 90 07/25/2020   PLT 361 07/25/2020    Attestation Statements:   Reviewed by clinician on day of visit: allergies, medications, problem list, medical history, surgical history, family history, social history, and previous encounter notes.  Coral Ceo, CMA, am acting as transcriptionist for Coralie Common, MD.  I have reviewed the above documentation for accuracy and completeness, and I agree with the above. - Coralie Common, MD

## 2021-06-13 ENCOUNTER — Telehealth (INDEPENDENT_AMBULATORY_CARE_PROVIDER_SITE_OTHER): Payer: Self-pay

## 2021-06-13 NOTE — Telephone Encounter (Signed)
Ozempic prior authorization-No Prior Authorization is required at this time based on the alternative drug you chose to prescribe.;[id number]

## 2021-06-14 ENCOUNTER — Encounter (INDEPENDENT_AMBULATORY_CARE_PROVIDER_SITE_OTHER): Payer: Self-pay

## 2021-06-19 ENCOUNTER — Ambulatory Visit: Payer: Self-pay | Admitting: Psychology

## 2021-06-27 NOTE — Psych (Signed)
Virtual Visit via Video Note  I connected with Kristin Blanchard on 04/19/21 at  9:00 AM EST by a video enabled telemedicine application and verified that I am speaking with the correct person using two identifiers.  Location: Patient: patient home Provider: clinical home office   I discussed the limitations of evaluation and management by telemedicine and the availability of in person appointments. The patient expressed understanding and agreed to proceed.  I discussed the assessment and treatment plan with the patient. The patient was provided an opportunity to ask questions and all were answered. The patient agreed with the plan and demonstrated an understanding of the instructions.   The patient was advised to call back or seek an in-person evaluation if the symptoms worsen or if the condition fails to improve as anticipated.  Pt was provided 240 minutes of non-face-to-face time during this encounter.   Lorin Glass, LCSW    [facility] PHP THERAPIST PROGRESS NOTE  Kristin Blanchard [id number]  Session Time: 9:00 - 10:00  Participation Level: Active  Behavioral Response: CasualAlertDepressed  Type of Therapy: Group Therapy  Treatment Goals addressed: Coping  Interventions: CBT, DBT, Supportive, and Reframing  Summary: Clinician led check-in regarding current stressors and situation. Clinician utilized active listening and empathetic response and validated patient emotions. Clinician facilitated processing group on pertinent issues.   Therapist Response: Kristin Blanchard is a 26 y.o. female who presents with depression symptoms. Patient arrived within time allowed and reports that she is feeling "anxious." Patient rates her mood at a 6 on a scale of 1-10 with 10 being great. Pt reports she is tired and not used to being up this early or on a schedule. Pt reports feeling "blah" yesterday and struggling with motivation and energy. Pt able to process. Pt engaged in discussion.          Session Time: 10:00 - 11:00   Participation Level: Active   Behavioral Response: CasualAlertDepressed   Type of Therapy: Group Therapy   Treatment Goals addressed: Coping   Interventions: CBT, DBT, Supportive and Reframing   Summary: [name] led discussion on increasing support. Group members shared what supports they have and where it is lacking. [name] encouraged ways to increase adult friendships including accepting invitations, joining clubs/activities, reaching out, and support groups.     Therapist Response: Pt engaged in discussion.        Session Time: 11:00- 12:00   Participation Level: Active   Behavioral Response: CasualAlertDepressed   Type of Therapy: Group Therapy   Treatment Goals addressed: Coping   Interventions: Supportive   Summary: Spiritual care group   Therapist Response: Pt engaged in discussion. See chaplain note       Session Time: 12:00- 1:00   Participation Level: Active   Behavioral Response: CasualAlertDepressed   Type of Therapy: Group Therapy, OT   Treatment Goals addressed: Coping   Interventions: Psychosocial skills training, Supportive   Summary: 12:00-12:50: Occupational Therapy group 12:50 -1:00 Clinician led check-out. Clinician assessed for immediate needs, medication compliance and efficacy, and safety concerns   Therapist Response: 12:00-12:50 Patient engaged in group. See OT note.   12:50 - 1:00: At check-out, patient rates her mood at a 4 on a scale of 1-10 with 10 being great. Pt reports afternoon plans of doing her hair. Pt demonstrates some progress as evidenced by participating in first group session. Patient denies SI/HI at the end of group.    Suicidal/Homicidal: Nowithout intent/plan  Plan: Pt will continue in PHP while working to decrease  depression symptoms, increase emotion regulation, and increase ability to manage symptoms in a healthy manner.  Diagnosis: Severe episode of recurrent major depressive disorder,  without psychotic features ([place]) [F33.2]    1. Severe episode of recurrent major depressive disorder, without psychotic features ([place])   2. MDD (major depressive disorder), recurrent episode, moderate ([place])       Lorin Glass, LCSW 06/27/2021

## 2021-06-29 ENCOUNTER — Ambulatory Visit (INDEPENDENT_AMBULATORY_CARE_PROVIDER_SITE_OTHER): Payer: Managed Care, Other (non HMO) | Admitting: Family Medicine

## 2021-06-29 ENCOUNTER — Encounter (INDEPENDENT_AMBULATORY_CARE_PROVIDER_SITE_OTHER): Payer: Self-pay | Admitting: Family Medicine

## 2021-06-29 ENCOUNTER — Other Ambulatory Visit: Payer: Self-pay

## 2021-06-29 VITALS — BP 118/79 | HR 76 | Temp 97.6°F | Ht 60.0 in | Wt 271.0 lb

## 2021-06-29 DIAGNOSIS — R7303 Prediabetes: Secondary | ICD-10-CM | POA: Diagnosis not present

## 2021-06-29 DIAGNOSIS — E559 Vitamin D deficiency, unspecified: Secondary | ICD-10-CM

## 2021-06-29 DIAGNOSIS — E669 Obesity, unspecified: Secondary | ICD-10-CM

## 2021-06-29 DIAGNOSIS — Z6841 Body Mass Index (BMI) 40.0 and over, adult: Secondary | ICD-10-CM

## 2021-06-29 MED ORDER — OZEMPIC (0.25 OR 0.5 MG/DOSE) 2 MG/1.5ML ~~LOC~~ SOPN: 0.2500 mg | PEN_INJECTOR | SUBCUTANEOUS | 0 refills | Status: DC

## 2021-06-29 MED ORDER — VITAMIN D (ERGOCALCIFEROL) 1.25 MG (50000 UNIT) PO CAPS: 50000.0000 [IU] | ORAL_CAPSULE | ORAL | 0 refills | Status: DC

## 2021-06-29 MED ORDER — QUETIAPINE FUMARATE 50 MG PO TABS: 50.0000 mg | ORAL_TABLET | Freq: Every day | ORAL | Status: DC

## 2021-06-29 NOTE — Psych (Signed)
Virtual Visit via Video Note  I connected with Desia Saban on 04/20/21 at  9:00 AM EST by a video enabled telemedicine application and verified that I am speaking with the correct person using two identifiers.  Location: Patient: patient home Provider: clinical home office   I discussed the limitations of evaluation and management by telemedicine and the availability of in person appointments. The patient expressed understanding and agreed to proceed.  I discussed the assessment and treatment plan with the patient. The patient was provided an opportunity to ask questions and all were answered. The patient agreed with the plan and demonstrated an understanding of the instructions.   The patient was advised to call back or seek an in-person evaluation if the symptoms worsen or if the condition fails to improve as anticipated.  Pt was provided 240 minutes of non-face-to-face time during this encounter.   Donia Guiles, LCSW    [facility] PHP THERAPIST PROGRESS NOTE  Quinesha Selinger [id number]  Session Time: 9:00 - 10:00  Participation Level: Active  Behavioral Response: CasualAlertDepressed  Type of Therapy: Group Therapy  Treatment Goals addressed: Coping  Interventions: CBT, DBT, Supportive, and Reframing  Summary: Clinician led check-in regarding current stressors and situation. Clinician utilized active listening and empathetic response and validated patient emotions. Clinician facilitated processing group on pertinent issues.   Therapist Response: Shannia Jacuinde is a 26 y.o. female who presents with depression symptoms. Patient arrived within time allowed and reports that she is feeling "very tired." Patient rates her mood at a 6 on a scale of 1-10 with 10 being great. Pt reports she spent time with friends yesterday and felt like it was hard rather than easy like normal. Pt reports an issue with her brother. Pt states poor sleep. Pt able to process. Pt engaged in discussion.          Session Time: 10:00 - 11:00   Participation Level: Active   Behavioral Response: CasualAlertDepressed   Type of Therapy: Group Therapy   Treatment Goals addressed: Coping   Interventions: CBT, DBT, Supportive and Reframing   Summary: [name] led processing group for pt's current struggles. Group members shared stressors and provided support and feedback. [name] brought in topics of boundaries, healthy relationships, and unhealthy thought processes to inform discussion.      Therapist Response:  Pt able to process and provide support to group.          Session Time: 11:00- 12:00   Participation Level: Active   Behavioral Response: CasualAlertDepressed   Type of Therapy: Group Therapy, OT   Treatment Goals addressed: Coping   Interventions: Psychosocial skills training, Supportive   Summary: Occupational Therapy group   Therapist Response: Patient engaged in group. See OT note.         Session Time: 12:00 -1:00   Participation Level: Active   Behavioral Response: CasualAlertDepressed   Type of Therapy: Group therapy   Treatment Goals addressed: Coping   Interventions: CBT; Solution focused; Supportive; Reframing   Summary: 12:00 - 12:50: [name] led discussion on how to fill unplanned time. Group members shared ways in which downtime negatively impacts mental health and often causes them to dwell on negative thinking. Group brainstormed ways to manage down time and problem solved how to handle barriers.  12:50 -1:00 Clinician led check-out. Clinician assessed for immediate needs, medication compliance and efficacy, and safety concerns   Therapist Response: 12:00 - 12:50: Pt engaged in discussion. 12:50 - 1:00: At check-out, patient rates her mood at a  6 on a scale of 1-10 with 10 being great. Pt reports afternoon plans of taking a nap. Pt demonstrates some progress as evidenced by increasing socialization. Patient denies SI/HI at the end of  group.    Suicidal/Homicidal: Nowithout intent/plan  Plan: Pt will continue in PHP while working to decrease depression symptoms, increase emotion regulation, and increase ability to manage symptoms in a healthy manner.  Diagnosis: Severe episode of recurrent major depressive disorder, without psychotic features (HCC) [F33.2]    1. Severe episode of recurrent major depressive disorder, without psychotic features (HCC)       Donia Guiles, LCSW 06/29/2021

## 2021-06-29 NOTE — Progress Notes (Signed)
Chief Complaint:   OBESITY Kristin Blanchard is here to discuss her progress with her obesity treatment plan along with follow-up of her obesity related diagnoses. [name] is on practicing portion control and making smarter food choices, such as increasing vegetables and decreasing simple carbohydrates and states she is following her eating plan approximately 50% of the time. [name] states she is doing 3000-5000 steps 7 times per week.  Today's visit was #: 15 Starting weight: 244 lbs Starting date: 07/25/2020 Today's weight: 271 lbs Today's date: 06/29/2021 Total lbs lost to date: 0 Total lbs lost since last in-office visit: 0  Interim History: A family friend passed away and pt recognizes she was alternating between stress eating and stress not eating. She wants a stricter meal plan with calorie and protein allotments for each meal. The next few weeks doesn't have much going on besides looking for a job.  Subjective:   1. Prediabetes Pt is not on Ozempic, as [facility] is refusing to fill it. Pt reports some increased cravings for carbohydrates.  2. Vitamin D deficiency Pt denies nausea, vomiting, and muscle weakness but notes fatigue. She is on prescription Vit D.  Assessment/Plan:   1. Prediabetes Marque will continue to work on weight loss, exercise, and decreasing simple carbohydrates to help decrease the risk of diabetes.   Refill- Semaglutide,0.25 or 0.5MG /DOS, (OZEMPIC, 0.25 OR 0.5 MG/DOSE,) 2 MG/1.5ML SOPN; Inject 0.25 mg into the skin once a week.  Dispense: 1.5 mL; Refill: 0  2. Vitamin D deficiency Low Vitamin D level contributes to fatigue and are associated with obesity, breast, and colon cancer. She agrees to continue to take prescription Vitamin D 50,000 IU every week and will follow-up for routine testing of Vitamin D, at least 2-3 times per year to avoid over-replacement.  Refill- Vitamin D, Ergocalciferol, (DRISDOL) 1.25 MG (50000 UNIT) CAPS capsule; Take 1 capsule (50,000  Units total) by mouth every 7 (seven) days.  Dispense: 4 capsule; Refill: 0  3. Obesity with current BMI of 53.0 [name] is currently in the action stage of change. As such, her goal is to continue with weight loss efforts. She has agreed to keeping a food journal and adhering to recommended goals of 1150-1300 calories and 80+ grams protein.   Exercise goals: All adults should avoid inactivity. Some physical activity is better than none, and adults who participate in any amount of physical activity gain some health benefits.  Behavioral modification strategies: increasing lean protein intake, meal planning and cooking strategies, keeping healthy foods in the home, and planning for success.  [name] has agreed to follow-up with our clinic in 3 weeks. She was informed of the importance of frequent follow-up visits to maximize her success with intensive lifestyle modifications for her multiple health conditions.   Objective:   Blood pressure 118/79, pulse 76, temperature 97.6 F (36.4 C), height 5' (1.524 m), weight 271 lb (122.9 kg), last menstrual period 06/27/2021, SpO2 98 %. Body mass index is 52.93 kg/m.  General: Cooperative, alert, well developed, in no acute distress. HEENT: Conjunctivae and lids unremarkable. Cardiovascular: Regular rhythm.  Lungs: Normal work of breathing. Neurologic: No focal deficits.   Lab Results  Component Value Date   CREATININE 0.69 05/11/2021   BUN 8 05/11/2021   NA 140 05/11/2021   K 4.5 05/11/2021   CL 102 05/11/2021   CO2 23 05/11/2021   Lab Results  Component Value Date   ALT 18 05/11/2021   AST 17 05/11/2021   ALKPHOS 89 05/11/2021  BILITOT <0.2 05/11/2021   Lab Results  Component Value Date   HGBA1C 5.7 (H) 05/11/2021   HGBA1C 5.5 07/25/2020   HGBA1C 5.2 08/10/2016   HGBA1C 5.1 03/09/2016   HGBA1C 5.2 11/15/2014   Lab Results  Component Value Date   INSULIN 20.2 05/11/2021   INSULIN 23.1 07/25/2020   Lab Results  Component Value  Date   TSH 1.510 07/25/2020   Lab Results  Component Value Date   CHOL 187 05/11/2021   HDL 69 05/11/2021   LDLCALC 104 (H) 05/11/2021   TRIG 74 05/11/2021   CHOLHDL 2.4 03/09/2016   Lab Results  Component Value Date   VD25OH 29.9 (L) 05/11/2021   VD25OH 17.0 (L) 07/25/2020   VD25OH 14 (L) 05/07/2016   Lab Results  Component Value Date   WBC 4.0 07/25/2020   HGB 12.6 07/25/2020   HCT 38.9 07/25/2020   MCV 90 07/25/2020   PLT 361 07/25/2020    Attestation Statements:   Reviewed by clinician on day of visit: allergies, medications, problem list, medical history, surgical history, family history, social history, and previous encounter notes.  Edmund Hilda, CMA, am acting as transcriptionist for Reuben Likes, MD.  I have reviewed the above documentation for accuracy and completeness, and I agree with the above. - Reuben Likes, MD

## 2021-07-03 NOTE — Telephone Encounter (Signed)
Dr.Ukleja 

## 2021-07-04 MED ORDER — TRULICITY 0.75 MG/0.5ML ~~LOC~~ SOAJ: 0.7500 mg | SUBCUTANEOUS | 0 refills | Status: DC

## 2021-07-17 ENCOUNTER — Ambulatory Visit: Payer: 59 | Admitting: Psychology

## 2021-07-20 ENCOUNTER — Ambulatory Visit (INDEPENDENT_AMBULATORY_CARE_PROVIDER_SITE_OTHER): Payer: Managed Care, Other (non HMO) | Admitting: Family Medicine

## 2021-07-20 NOTE — Psych (Signed)
Virtual Visit via Video Note  I connected with Kristin Blanchard on 04/21/21 at  9:00 AM EST by a video enabled telemedicine application and verified that I am speaking with the correct person using two identifiers.  Location: Patient: patient home Provider: clinical home office   I discussed the limitations of evaluation and management by telemedicine and the availability of in person appointments. The patient expressed understanding and agreed to proceed.  I discussed the assessment and treatment plan with the patient. The patient was provided an opportunity to ask questions and all were answered. The patient agreed with the plan and demonstrated an understanding of the instructions.   The patient was advised to call back or seek an in-person evaluation if the symptoms worsen or if the condition fails to improve as anticipated.  Pt was provided 240 minutes of non-face-to-face time during this encounter.   Kristin Guiles, LCSW    [facility] PHP THERAPIST PROGRESS NOTE  [name] [id number]  Session Time: 9:00 - 10:00  Participation Level: Active  Behavioral Response: CasualAlertDepressed  Type of Therapy: Group Therapy  Treatment Goals addressed: Coping  Interventions: CBT, DBT, Supportive, and Reframing  Summary: Clinician led check-in regarding current stressors and situation. Clinician utilized active listening and empathetic response and validated patient emotions. Clinician facilitated processing group on pertinent issues.   Therapist Response: Kristin Blanchard is a 26 y.o. female who presents with depression symptoms. Patient arrived within time allowed and reports that she is feeling "okay." Patient rates her mood at a 7 on a scale of 1-10 with 10 being great. Pt reports a struggle with her brother and difficulty with boundaries in her family. Pt reports sleeping well yesterday. Pt able to process. Pt engaged in discussion.         Session Time: 10:00 - 11:00    Participation Level: Active   Behavioral Response: CasualAlertDepressed   Type of Therapy: Group Therapy   Treatment Goals addressed: Coping   Interventions: CBT, DBT, Supportive and Reframing   Summary: [name] led discussion on rest. [name] discussed the need to rewrite social story of rest being earned or last on the list and assert that rest is productive. Group members discussed barriers to allowing themselves rest and the negative self-talk involved.  [name] worked with group to thought challenge and apply self-coaching strategies.    Therapist Response: Pt engaged in discussion and reports struggle with allowing themselves rest.        Session Time: 11:00- 12:00   Participation Level: Active   Behavioral Response: CasualAlertDepressed   Type of Therapy: Group Therapy, OT   Treatment Goals addressed: Coping   Interventions: Supportive   Summary: Occupational Therapy group   Therapist Response: Pt engaged in discussion. See note.        Session Time: 12:00- 1:00   Participation Level: Active   Behavioral Response: CasualAlertDepressed   Type of Therapy: Group Therapy   Treatment Goals addressed: Coping   Interventions: CBT, DBT, Supportive and Reframing   Summary: 12:00-12:50: [name] continued topic of DBT distress tolerance skills. [name] introduced STOP skill and group discussed ways they could apply the STOP skill in their every day life.  12:50 -1:00 Clinician led check-out. Clinician assessed for immediate needs, medication compliance and efficacy, and safety concerns   Therapist Response: 12:00-12:50  Pt engaged in discussion and reports she can apply STOP when feeling impulsive.  12:50 - 1:00: At check-out, patient rates her mood at a 7.5 on a scale of 1-10 with 10  being great. Pt reports afternoon plans of seeing a friend. Pt demonstrates some progress as evidenced by increased mood. Patient denies SI/HI at the end of group.    Suicidal/Homicidal: Nowithout  intent/plan  Plan: Pt will continue in PHP while working to decrease depression symptoms, increase emotion regulation, and increase ability to manage symptoms in a healthy manner.  Diagnosis: Severe episode of recurrent major depressive disorder, without psychotic features (HCC) [F33.2]    1. Severe episode of recurrent major depressive disorder, without psychotic features (HCC)       Kristin Guiles, LCSW 07/20/2021

## 2021-07-20 NOTE — Psych (Signed)
Virtual Visit via Video Note  I connected with James Lafalce on 05/03/21 at  9:00 AM EST by a video enabled telemedicine application and verified that I am speaking with the correct person using two identifiers.  Location: Patient: patient home Provider: clinical home office   I discussed the limitations of evaluation and management by telemedicine and the availability of in person appointments. The patient expressed understanding and agreed to proceed.  I discussed the assessment and treatment plan with the patient. The patient was provided an opportunity to ask questions and all were answered. The patient agreed with the plan and demonstrated an understanding of the instructions.   The patient was advised to call back or seek an in-person evaluation if the symptoms worsen or if the condition fails to improve as anticipated.  Pt was provided 240 minutes of non-face-to-face time during this encounter.   Donia Guiles, LCSW    [facility] PHP THERAPIST PROGRESS NOTE  Tanajah Boulter [id number]  Session Time: 9:00 - 10:00  Participation Level: Active  Behavioral Response: CasualAlertDepressed  Type of Therapy: Group Therapy  Treatment Goals addressed: Coping  Interventions: CBT, DBT, Supportive, and Reframing  Summary: Clinician led check-in regarding current stressors and situation. Clinician utilized active listening and empathetic response and validated patient emotions. Clinician facilitated processing group on pertinent issues.   Therapist Response: Freeda Spivey is a 26 y.o. female who presents with depression symptoms. Patient arrived within time allowed and reports that she is feeling "good." Patient rates her mood at a 8 on a scale of 1-10 with 10 being great. Pt reports a friend is in town and is around a lot. Pt reports struggling with managing her energy and asserting her needs with friend. Pt able to process. Pt engaged in discussion.         Session Time: 10:00 -  11:00   Participation Level: Active   Behavioral Response: CasualAlertDepressed   Type of Therapy: Group Therapy   Treatment Goals addressed: Coping   Interventions: CBT, DBT, Supportive and Reframing   Summary: [name] led discussion on impulsivity. Group discussed struggles with impulsivity. [name] highlighted theme of immediacy. Group built insight around the way immediacy interacts with impulsivity. [name] encouraged pt's to consider mantras and grounding statements to remind themselves there is time to think/feel/act.     Therapist Response:  Pt engaged in discussion and is able to make connections and gain insight.        Session Time: 11:00- 12:00   Participation Level: Active   Behavioral Response: CasualAlertDepressed   Type of Therapy: Group Therapy, OT   Treatment Goals addressed: Coping   Interventions: Supportive   Summary: Spiritual care group   Therapist Response: Pt engaged in discussion.        Session Time: 12:00- 1:00   Participation Level: Active   Behavioral Response: CasualAlertDepressed   Type of Therapy: Group Therapy, OT   Treatment Goals addressed: Coping   Interventions: Psychosocial skills training, Supportive   Summary: 12:00-12:50: Occupational Therapy group 12:50 -1:00 Clinician led check-out. Clinician assessed for immediate needs, medication compliance and efficacy, and safety concerns   Therapist Response: 12:00-12:50 Patient engaged in group. 12:50 - 1:00: At check-out, patient rates her mood at a 8 on a scale of 1-10 with 10 being great. Pt reports afternoon plans of seeing a friend. Pt demonstrates some progress as evidenced by increased recognition of boundaries. Patient denies SI/HI at the end of group.    Suicidal/Homicidal: Nowithout intent/plan  Plan: Pt will  continue in PHP while working to decrease depression symptoms, increase emotion regulation, and increase ability to manage symptoms in a healthy  manner.  Diagnosis: Severe episode of recurrent major depressive disorder, without psychotic features (HCC) [F33.2]    1. Severe episode of recurrent major depressive disorder, without psychotic features (HCC)       Donia Guiles, LCSW 07/20/2021

## 2021-07-20 NOTE — Psych (Signed)
Virtual Visit via Video Note  I connected with Kristin Blanchard on 05/01/21 at  9:00 AM EST by a video enabled telemedicine application and verified that I am speaking with the correct person using two identifiers.  Location: Patient: patient home Provider: clinical home office   I discussed the limitations of evaluation and management by telemedicine and the availability of in person appointments. The patient expressed understanding and agreed to proceed.  I discussed the assessment and treatment plan with the patient. The patient was provided an opportunity to ask questions and all were answered. The patient agreed with the plan and demonstrated an understanding of the instructions.   The patient was advised to call back or seek an in-person evaluation if the symptoms worsen or if the condition fails to improve as anticipated.  Pt was provided 240 minutes of non-face-to-face time during this encounter.   Donia Guiles, LCSW    [facility] PHP THERAPIST PROGRESS NOTE  Kristin Blanchard [id number]  Session Time: 9:00 - 10:00  Participation Level: Active  Behavioral Response: CasualAlertDepressed  Type of Therapy: Group Therapy  Treatment Goals addressed: Coping  Interventions: CBT, DBT, Supportive, and Reframing  Summary: Clinician led check-in regarding current stressors and situation. Clinician utilized active listening and empathetic response and validated patient emotions. Clinician facilitated processing group on pertinent issues.   Therapist Response: Jnyah Brazee is a 26 y.o. female who presents with depression symptoms. Patient arrived within time allowed and reports that she is feeling "good." Patient rates her mood at a 8 on a scale of 1-10 with 10 being great. Pt reports visiting her family was "overall good, but really up and down." Pt reports struggle with her brother's family not respecting her mental health. Pt states difficulty with appetite and needing people to remind her  to eat.  Pt able to process. Pt engaged in discussion.         Session Time: 10:00 - 11:00   Participation Level: Active   Behavioral Response: CasualAlertDepressed   Type of Therapy: Group Therapy   Treatment Goals addressed: Coping   Interventions: CBT, DBT, Supportive and Reframing   Summary: [name] led discussion on communicating our struggles with our support team. [name] introduced the concept of giving disclosures to the people close to [place] regarding the way we act in specific situations or the way we process certain stimuli. Group members discussed ways to provide this "road map to our brain" and in what situations this may be helpful to them     Therapist Response: Pt engaged in discussion and is able to determine situations in which providing a disclosure can be helpful and practiced doing so.            Session Time: 11:00- 12:00   Participation Level: Active   Behavioral Response: CasualAlertDepressed   Type of Therapy: Group Therapy, OT   Treatment Goals addressed: Coping   Interventions: Supportive   Summary: Occupational Therapy group   Therapist Response: Pt engaged in discussion.          Session Time: 12:00- 1:00   Participation Level: Active   Behavioral Response: CasualAlertDepressed   Type of Therapy: Group Therapy   Treatment Goals addressed: Coping   Interventions: CBT, DBT, Supportive and Reframing   Summary: 12:00-12:50: [name] led discussion on positive self-esteem. Group shared struggles they experience with self-esteem. Group highlights difficulty in accepting compliments. [name] encouraged pt's to use checking the facts as a way to challenge the negative thinking which accompanies receving compliments. [name]  suggested pt's consider making a compliment file in which they document positive things people say to/about them to review when they are feeling poorly about themselves. 12:50 -1:00 Clinician led check-out. Clinician assessed for immediate  needs, medication compliance and efficacy, and safety concerns   Therapist Response: 12:00-12:50  Pt engaged in conversation and shares difficulty in accepting compliments. Pt is able to process and reports being open to starting a compliment file.  12:50 - 1:00: At check-out, patient rates her mood at a 8 on a scale of 1-10 with 10 being great. Pt reports afternoon plans of seeing a friend. Pt demonstrates some progress as evidenced by increased assertiveness. Patient denies SI/HI at the end of group.    Suicidal/Homicidal: Nowithout intent/plan  Plan: Pt will continue in PHP while working to decrease depression symptoms, increase emotion regulation, and increase ability to manage symptoms in a healthy manner.  Diagnosis: Severe episode of recurrent major depressive disorder, without psychotic features (HCC) [F33.2]    1. Severe episode of recurrent major depressive disorder, without psychotic features (HCC)       Donia Guiles, LCSW 07/20/2021

## 2021-07-20 NOTE — Psych (Signed)
Virtual Visit via Video Note  I connected with Kristin Blanchard on 04/24/21 at  9:00 AM EST by a video enabled telemedicine application and verified that I am speaking with the correct person using two identifiers.  Location: Patient: patient home Provider: clinical home office   I discussed the limitations of evaluation and management by telemedicine and the availability of in person appointments. The patient expressed understanding and agreed to proceed.  I discussed the assessment and treatment plan with the patient. The patient was provided an opportunity to ask questions and all were answered. The patient agreed with the plan and demonstrated an understanding of the instructions.   The patient was advised to call back or seek an in-person evaluation if the symptoms worsen or if the condition fails to improve as anticipated.  Pt was provided 240 minutes of non-face-to-face time during this encounter.   Donia Guiles, LCSW    [facility] PHP THERAPIST PROGRESS NOTE  Kristin Blanchard [id number]  Session Time: 9:00 - 10:00  Participation Level: Active  Behavioral Response: CasualAlertDepressed  Type of Therapy: Group Therapy  Treatment Goals addressed: Coping  Interventions: CBT, DBT, Supportive, and Reframing  Summary: Clinician led check-in regarding current stressors and situation. Clinician utilized active listening and empathetic response and validated patient emotions. Clinician facilitated processing group on pertinent issues.   Therapist Response: Leanah Kolander is a 26 y.o. female who presents with depression symptoms. Patient arrived within time allowed and reports that she is feeling "excited." Patient rates her mood at a 8 on a scale of 1-10 with 10 being great. Pt reports she is traveling to [place] for Thanksgiving with her family this afternoon. Pt reports some anxiety regarding family dynamics and how she will handle it. Pt reports a stressful weekend due to her mom's friend  dying. Pt able to process. Pt engaged in discussion.         Session Time: 10:00 - 11:00   Participation Level: Active   Behavioral Response: CasualAlertDepressed   Type of Therapy: Group Therapy   Treatment Goals addressed: Coping   Interventions: CBT, DBT, Supportive and Reframing   Summary: [name] led discussion on making difficult decisions. Group members discussed decisions they are struggling with. [name] utilized CBT thought challenging and DBT walking the middle path to inform discussion.    Therapist Response:  Pt engaged in discussion.        Session Time: 11:00- 12:00   Participation Level: Active   Behavioral Response: CasualAlertDepressed   Type of Therapy: Group Therapy, OT   Treatment Goals addressed: Coping   Interventions: CBT, DBT, Supportive and Reframing   Summary: [name] introduced CBT and the way in which it can provide context for addressing stumbling blocks. Group discussed "the problem is not the problem, the problem is how we're thinking about the problem" and tried to change perspective on current struggles.    Therapist Response: Pt engaged in discussion and is able to attempt reframing using CBT.       Session Time: 12:00- 1:00   Participation Level: Active   Behavioral Response: CasualAlertDepressed   Type of Therapy: Group Therapy, OT   Treatment Goals addressed: Coping   Interventions: Psychosocial skills training, Supportive   Summary: 12:00-12:50: Occupational Therapy group 12:50 -1:00 Clinician led check-out. Clinician assessed for immediate needs, medication compliance and efficacy, and safety concerns   Therapist Response: 12:00-12:50 Patient engaged in group.  12:50 - 1:00: At check-out, patient rates her mood at a 6 on a scale of 1-10  with 10 being great. Pt reports afternoon plans of traveling. Pt demonstrates some progress as evidenced by increased focus on herself. Patient denies SI/HI at the end of  group.    Suicidal/Homicidal: Nowithout intent/plan  Plan: Pt will continue in PHP while working to decrease depression symptoms, increase emotion regulation, and increase ability to manage symptoms in a healthy manner.  Diagnosis: Severe episode of recurrent major depressive disorder, without psychotic features (HCC) [F33.2]    1. Severe episode of recurrent major depressive disorder, without psychotic features (HCC)       Donia Guiles, LCSW 07/20/2021

## 2021-07-28 ENCOUNTER — Other Ambulatory Visit (INDEPENDENT_AMBULATORY_CARE_PROVIDER_SITE_OTHER): Payer: Self-pay | Admitting: Family Medicine

## 2021-07-28 DIAGNOSIS — R7303 Prediabetes: Secondary | ICD-10-CM

## 2021-07-31 ENCOUNTER — Other Ambulatory Visit (INDEPENDENT_AMBULATORY_CARE_PROVIDER_SITE_OTHER): Payer: Self-pay | Admitting: Family Medicine

## 2021-07-31 DIAGNOSIS — R7303 Prediabetes: Secondary | ICD-10-CM

## 2021-07-31 NOTE — Telephone Encounter (Signed)
Already sent.

## 2021-07-31 NOTE — Telephone Encounter (Signed)
LAST APPOINTMENT DATE: 06/29/21 NEXT APPOINTMENT DATE: 08/08/21   [facility] [id number] [place], [place] - [place] [facility] [place] [address]  [place] [address] Phone: [phone] Fax: [phone]  [facility] - [place], [place] - [address] [place] [address] Phone: [phone] Fax: [phone]  Patient is requesting a refill of the following medications: Pending Prescriptions:                       Disp   Refills   Dulaglutide (TRULICITY) [id number] SOPN 2 mL   0       Sig: Inject 0.75 mg into the skin once a week.   Date last filled: 07/04/21 Previously prescribed by Dr Jearld Shines  Lab Results      Component                Value               Date                      HGBA1C                   5.7 (H)             05/11/2021                HGBA1C                   5.5                 07/25/2020                HGBA1C                   5.2                 08/10/2016           Lab Results      Component                Value               Date                      LDLCALC                  104 (H)             05/11/2021                CREATININE               0.69                05/11/2021           Lab Results      Component                Value               Date                      VD25OH                   29.9 (L)            05/11/2021  VD25OH                   17.0 (L)            07/25/2020                VD25OH                   14 (L)              05/07/2016            BP Readings from Last 3 Encounters: 06/29/21 : 118/79 06/08/21 : 123/83 05/11/21 : 123/85

## 2021-08-02 NOTE — Psych (Signed)
Virtual Visit via Video Note  I connected with Kristin Blanchard on 05/04/21 at  9:00 AM EST by a video enabled telemedicine application and verified that I am speaking with the correct person using two identifiers.  Location: Patient: patient home Provider: clinical home office   I discussed the limitations of evaluation and management by telemedicine and the availability of in person appointments. The patient expressed understanding and agreed to proceed.  I discussed the assessment and treatment plan with the patient. The patient was provided an opportunity to ask questions and all were answered. The patient agreed with the plan and demonstrated an understanding of the instructions.   The patient was advised to call back or seek an in-person evaluation if the symptoms worsen or if the condition fails to improve as anticipated.  Pt was provided 240 minutes of non-face-to-face time during this encounter.   Donia Guiles, LCSW    [facility] PHP THERAPIST PROGRESS NOTE  Kristin Blanchard [id number]  Session Time: 9:00 - 10:00  Participation Level: Active  Behavioral Response: CasualAlertDepressed  Type of Therapy: Group Therapy  Treatment Goals addressed: Coping  Interventions: CBT, DBT, Supportive, and Reframing  Summary: Clinician led check-in regarding current stressors and situation. Clinician utilized active listening and empathetic response and validated patient emotions. Clinician facilitated processing group on pertinent issues.   Therapist Response: Kristin Blanchard is a 26 y.o. female who presents with depression symptoms. Patient arrived within time allowed and reports that she is feeling "okay." Patient rates her mood at a 6 on a scale of 1-10 with 10 being great. Pt reports increased anxiety yesterday due to sharing with her friends her MH issues. Pt states struggling with rejection. Pt able to process. Pt engaged in discussion.         Session Time: 10:00 - 11:00    Participation Level: Active   Behavioral Response: CasualAlertDepressed   Type of Therapy: Group Therapy   Treatment Goals addressed: Coping   Interventions: CBT, DBT, Supportive and Reframing   Summary: [name] led discussion on trust and how to establish healthy trust. Group discussed common missteps such as disclosing personal information too soon, ignoring behaviors being displayed, inacurately defining the relationship, and not giving people credit. [name] utilized CBT thought challenging principles to encourage pt's to rely on "evidence" versus feelings. Group members shared struggles they experience with trust.     Therapist Response: Pt engaged in discussion and is able to identify areas of improvement in their trust process.            Session Time: 11:00- 12:00   Participation Level: Active   Behavioral Response: CasualAlertDepressed   Type of Therapy: Group Therapy, OT   Treatment Goals addressed: Coping   Interventions: Supportive   Summary: Occupational Therapy group   Therapist Response: Pt engaged in discussion. See note         Session Time: 12:00- 1:00   Participation Level: Active   Behavioral Response: CasualAlertDepressed   Type of Therapy: Group Therapy   Treatment Goals addressed: Coping   Interventions: CBT, DBT, Supportive and Reframing   Summary: 12:00-12:50: [name] introduced topic of vulnerability. Group viewed TED talk "the power of vulnerability" to aid discussion. Group members shared the relationship they have with vulnerability and how it impacts their relationships.  12:50 -1:00 Clinician led check-out. Clinician assessed for immediate needs, medication compliance and efficacy, and safety concerns   Therapist Response: 12:00-12:50  Pt engaged in discussion and reports vulnerability is a struggle for her.  12:50 - 1:00: At check-out, patient rates her mood at a 5.5 on a scale of 1-10 with 10 being great. Pt reports afternoon plans of  spending time with her friend in town. Pt demonstrates some progress as evidenced by increased sharing with those in her life. Patient denies SI/HI at the end of group.    Suicidal/Homicidal: Nowithout intent/plan  Plan: Pt will continue in PHP while working to decrease depression symptoms, increase emotion regulation, and increase ability to manage symptoms in a healthy manner.  Diagnosis: Severe episode of recurrent major depressive disorder, without psychotic features (HCC) [F33.2]    1. Severe episode of recurrent major depressive disorder, without psychotic features (HCC)       Donia Guiles, LCSW

## 2021-08-02 NOTE — Psych (Signed)
Virtual Visit via Video Note  I connected with Kristin Blanchard on 05/08/21 at  9:00 AM EST by a video enabled telemedicine application and verified that I am speaking with the correct person using two identifiers.  Location: Patient: patient home Provider: clinical home office   I discussed the limitations of evaluation and management by telemedicine and the availability of in person appointments. The patient expressed understanding and agreed to proceed.  I discussed the assessment and treatment plan with the patient. The patient was provided an opportunity to ask questions and all were answered. The patient agreed with the plan and demonstrated an understanding of the instructions.   The patient was advised to call back or seek an in-person evaluation if the symptoms worsen or if the condition fails to improve as anticipated.  Pt was provided 240 minutes of non-face-to-face time during this encounter.   Kristin Guiles, LCSW    [facility] PHP THERAPIST PROGRESS NOTE  Kristin Blanchard [id number]  Session Time: 9:00 - 10:00  Participation Level: Active  Behavioral Response: CasualAlertDepressed  Type of Therapy: Group Therapy  Treatment Goals addressed: Coping  Interventions: CBT, DBT, Supportive, and Reframing  Summary: Clinician led check-in regarding current stressors and situation. Clinician utilized active listening and empathetic response and validated patient emotions. Clinician facilitated processing group on pertinent issues.   Therapist Response: Kristin Blanchard is a 26 y.o. female who presents with depression symptoms. Patient arrived within time allowed and reports that she is feeling "good." Patient rates her mood at a 7 on a scale of 1-10 with 10 being great. Pt reports her weekend was spent at a work retreat and it was "perfect." Pt states she was tired all weekend from the exertion of being around people and being "on." Pt reports struggling with moderating herself. Pt able to  process. Pt engaged in discussion.         Session Time: 10:00 - 11:00   Participation Level: Active   Behavioral Response: CasualAlertDepressed   Type of Therapy: Group Therapy   Treatment Goals addressed: Coping   Interventions: CBT, DBT, Supportive and Reframing   Summary: [name] led discussion on healthy relationships. Group members shared issues they have experienced in past relationships and in identifying what "healthy" looks like. [name] discussed respect, trust, and honesty as non-negotiable traits in a healthy dynamic. Group shared and problem solved barriers to recognizing and priortizing these traits.     Therapist Response: Pt engaged in discussion and is able to process.         Session Time: 11:00- 12:00   Participation Level: Active   Behavioral Response: CasualAlertDepressed   Type of Therapy: Group Therapy, OT   Treatment Goals addressed: Coping   Interventions: Supportive, Skills training   Summary: Occupational Therapy group   Therapist Response: Pt engaged in discussion. See note         Session Time: 12:00- 1:00   Participation Level: Active   Behavioral Response: CasualAlertDepressed   Type of Therapy: Group Therapy   Treatment Goals addressed: Coping   Interventions: CBT, DBT, Supportive and Reframing   Summary: 12:00-12:50: [name] introduced CBT and the way in which it can provide context for addressing stumbling blocks. Group discussed "the problem is not the problem, the problem is how we're thinking about the problem" and tried to change perspective on current struggles.  12:50 -1:00 Clinician led check-out. Clinician assessed for immediate needs, medication compliance and efficacy, and safety concerns   Therapist Response: 12:00-12:50  Pt engaged in  discussion and is able to attempt reframing using CBT.  12:50 - 1:00: At check-out, patient rates her mood at a 7 on a scale of 1-10 with 10 being great. Pt reports afternoon plans of running  errands. Pt demonstrates some progress as evidenced by increased activity. Patient denies SI/HI at the end of group.    Suicidal/Homicidal: Nowithout intent/plan  Plan: Pt will continue in PHP while working to decrease depression symptoms, increase emotion regulation, and increase ability to manage symptoms in a healthy manner.  Diagnosis: Severe episode of recurrent major depressive disorder, without psychotic features (HCC) [F33.2]    1. Severe episode of recurrent major depressive disorder, without psychotic features (HCC)       Kristin Guiles, LCSW

## 2021-08-02 NOTE — Psych (Signed)
Virtual Visit via Video Note  I connected with Kristin Blanchard on 05/10/21 at  9:00 AM EST by a video enabled telemedicine application and verified that I am speaking with the correct person using two identifiers.  Location: Patient: patient home Provider: clinical home office   I discussed the limitations of evaluation and management by telemedicine and the availability of in person appointments. The patient expressed understanding and agreed to proceed.  I discussed the assessment and treatment plan with the patient. The patient was provided an opportunity to ask questions and all were answered. The patient agreed with the plan and demonstrated an understanding of the instructions.   The patient was advised to call back or seek an in-person evaluation if the symptoms worsen or if the condition fails to improve as anticipated.  Pt was provided 240 minutes of non-face-to-face time during this encounter.   Lorin Glass, LCSW    [facility] PHP THERAPIST PROGRESS NOTE  Kristin Blanchard [id number]  Session Time: 9:00 - 10:00  Participation Level: Active  Behavioral Response: CasualAlertDepressed  Type of Therapy: Group Therapy  Treatment Goals addressed: Coping  Interventions: CBT, DBT, Supportive, and Reframing  Summary: Clinician led check-in regarding current stressors and situation. Clinician utilized active listening and empathetic response and validated patient emotions. Clinician facilitated processing group on pertinent issues.   Therapist Response: Kristin Blanchard is a 26 y.o. female who presents with depression symptoms. Patient arrived within time allowed and reports that she is feeling "a little irritated." Patient rates her mood at a 7 on a scale of 1-10 with 10 being great. Pt states she woke from a dream that upset her this morning and the irritation is lingering. Pt reports she "felt off" all day after oversleeping and missing group. Pt states she is struggling with rumination  about college. Pt able to process. Pt engaged in discussion.         Session Time: 10:00 - 11:00   Participation Level: Active   Behavioral Response: CasualAlertDepressed   Type of Therapy: Group Therapy   Treatment Goals addressed: Coping   Interventions: CBT, DBT, Supportive and Reframing   Summary: [name] led discussion on emotional reasoning and  provided context in terms of CBT unhealthy thought patterns. [name] encouraged pt's to be more specific in their speech and thought dialogues to highlight the presence of a feeling, a mutable and time limited experience. Group members were encouraged to use reminder: "feelings do not equal fact."    Therapist Response:  Pt engaged in discussion and is able to identify patterns in which they struggle to differentiate feelings and fact.        Session Time: 11:00- 12:00   Participation Level: Active   Behavioral Response: CasualAlertDepressed   Type of Therapy: Group Therapy, OT   Treatment Goals addressed: Coping   Interventions: Supportive   Summary: Spiritual care group   Therapist Response: Pt engaged in discussion.        Session Time: 12:00- 1:00   Participation Level: Active   Behavioral Response: CasualAlertDepressed   Type of Therapy: Group Therapy, OT   Treatment Goals addressed: Coping   Interventions: Psychosocial skills training, Supportive   Summary: 12:00-12:50: Occupational Therapy group 12:50 -1:00 Clinician led check-out. Clinician assessed for immediate needs, medication compliance and efficacy, and safety concerns   Therapist Response: 12:00-12:50 Patient engaged in group. 12:50 - 1:00: At check-out, patient rates her mood at a 7 on a scale of 1-10 with 10 being great. Pt reports afternoon plans  of talking to a friend. Pt demonstrates some progress as evidenced by increased willingness to do hard things. Patient denies SI/HI at the end of group.    Suicidal/Homicidal: Nowithout intent/plan  Plan:  Pt will continue in PHP while working to decrease depression symptoms, increase emotion regulation, and increase ability to manage symptoms in a healthy manner.  Diagnosis: Severe episode of recurrent major depressive disorder, without psychotic features ([place]) [F33.2]    1. Severe episode of recurrent major depressive disorder, without psychotic features ([place])       Lorin Glass, LCSW

## 2021-08-03 ENCOUNTER — Other Ambulatory Visit (INDEPENDENT_AMBULATORY_CARE_PROVIDER_SITE_OTHER): Payer: Self-pay | Admitting: Family Medicine

## 2021-08-03 DIAGNOSIS — R7303 Prediabetes: Secondary | ICD-10-CM

## 2021-08-03 MED ORDER — TRULICITY 0.75 MG/0.5ML ~~LOC~~ SOAJ: 0.7500 mg | SUBCUTANEOUS | 0 refills | Status: DC

## 2021-08-03 NOTE — Telephone Encounter (Signed)
Dr.Ukleja 

## 2021-08-03 NOTE — Psych (Signed)
Virtual Visit via Video Note  I connected with Kristin Blanchard on 05/15/21 at  9:00 AM EST by a video enabled telemedicine application and verified that I am speaking with the correct person using two identifiers.  Location: Patient: patient home Provider: clinical home office   I discussed the limitations of evaluation and management by telemedicine and the availability of in person appointments. The patient expressed understanding and agreed to proceed.  I discussed the assessment and treatment plan with the patient. The patient was provided an opportunity to ask questions and all were answered. The patient agreed with the plan and demonstrated an understanding of the instructions.   The patient was advised to call back or seek an in-person evaluation if the symptoms worsen or if the condition fails to improve as anticipated.  Pt was provided 240 minutes of non-face-to-face time during this encounter.   Donia Guiles, LCSW    [facility] PHP THERAPIST PROGRESS NOTE  Crystel Demarco [id number]  Session Time: 9:00 - 10:00  Participation Level: Active  Behavioral Response: CasualAlertDepressed  Type of Therapy: Group Therapy  Treatment Goals addressed: Coping  Interventions: CBT, DBT, Supportive, and Reframing  Summary: Clinician led check-in regarding current stressors and situation. Clinician utilized active listening and empathetic response and validated patient emotions. Clinician facilitated processing group on pertinent issues.   Therapist Response: Kristin Blanchard is a 26 y.o. female who presents with depression symptoms. Patient arrived within time allowed and reports that she is feeling "good." Patient rates her mood at a 8 on a scale of 1-10 with 10 being great. Pt reports her house guest left and she is glad to have her own time back. Pt reports feeling very drained from social interaction. Pt reports thinking about her future and what she wants to do. Pt states she struggled with  negative thinking after an accident over the weekend. Pt able to process. Pt engaged in discussion.         Session Time: 10:00 - 11:00   Participation Level: Active   Behavioral Response: CasualAlertDepressed   Type of Therapy: Group Therapy   Treatment Goals addressed: Coping   Interventions: CBT, DBT, Supportive and Reframing   Summary: [name] led processing group for pt's current struggles. Group members shared stressors and provided support and feedback. [name] brought in topics of boundaries, healthy relationships, and unhealthy thought processes to inform discussion.     Therapist Response: Pt engaged in discussion and is able to process and provide support to group.       Session Time: 11:00- 12:00   Participation Level: Active   Behavioral Response: CasualAlertDepressed   Type of Therapy: Group Therapy   Treatment Goals addressed: Coping   Interventions: CBT, DBT, Supportive and Reframing   Summary: [name] continued topic of CBT cognitive distortions and introduced thought challenging as a way to  utilize the "challenge" C in C-C-C. Group utilized Administrator, Civil Service questions" as a way to introduce challenges and reframe distorted thinking. Group members worked through pt examples to practice challenging distorted thinking.    Therapist Response: Pt engaged in discussion and demonstrates understanding of challenging distorted thoughts through practice.          Session Time: 12:00- 1:00   Participation Level: Active   Behavioral Response: CasualAlertDepressed   Type of Therapy: Group Therapy   Treatment Goals addressed: Coping   Interventions: CBT, DBT, Supportive and Reframing   Summary: 12:00-12:50: [name] led discussion on rest. [name] discussed the need to rewrite social story of  rest being earned or last on the list and assert that rest is productive. Group members discussed barriers to allowing themselves rest and the negative self-talk involved.  [name] worked with  group to thought challenge and apply self-coaching strategies. 12:50 -1:00 Clinician led check-out. Clinician assessed for immediate needs, medication compliance and efficacy, and safety concerns   Therapist Response: 12:00-12:50 Pt engaged in discussion and reports struggle with allowing themselves rest.  12:50 - 1:00: At check-out, patient rates her mood at a 7 on a scale of 1-10 with 10 being great. Pt reports afternoon plans of completing a task. Pt demonstrates some progress as evidenced by stretching outside of her comfort zone. Patient denies SI/HI at the end of group.    Suicidal/Homicidal: Nowithout intent/plan  Plan: Pt will continue in PHP while working to decrease depression symptoms, increase emotion regulation, and increase ability to manage symptoms in a healthy manner.  Diagnosis: Severe episode of recurrent major depressive disorder, without psychotic features (HCC) [F33.2]    1. Severe episode of recurrent major depressive disorder, without psychotic features (HCC)       Donia Guiles, LCSW

## 2021-08-03 NOTE — Telephone Encounter (Signed)
LAST APPOINTMENT DATE: 06/26/21 ?NEXT APPOINTMENT DATE: 08/08/21 ? ? ?[id number] - [place], [place] - [address] AT [facility] [address] ?[address] ?[place] [address] ?Phone: [phone] Fax: [phone] ? ?[facility] - [place], [place] - [address] ?[address] ?[place] [address] ?Phone: [phone] Fax: [phone] ? ?Patient is requesting a refill of the following medications: ?Pending Prescriptions:                       Disp   Refills ?  Dulaglutide (TRULICITY) 0.75 MG/0.5ML SOPN 2 mL   0       ?Sig: Inject 0.75 mg into the skin once a week. ? ? ?Date last filled: 07/04/21 ?Previously prescribed by Dr Lawson Radar ? ?Lab Results ?     Component                Value               Date                 ?     HGBA1C                   5.7 (H)             05/11/2021           ?     HGBA1C                   5.5                 07/25/2020           ?     HGBA1C                   5.2                 08/10/2016           ?Lab Results ?     Component                Value               Date                 ?     LDLCALC                  104 (H)             05/11/2021           ?     CREATININE               0.69                05/11/2021           ?Lab Results ?     Component                Value               Date                 ?     VD25OH                   29.9 (L)            05/11/2021           ?  VD25OH                   17.0 (L)            07/25/2020           ?     VD25OH                   14 (L)              05/07/2016           ? ?BP Readings from Last 3 Encounters: ?06/29/21 : 118/79 ?06/08/21 : 123/83 ?05/11/21 : 123/85 ? ?

## 2021-08-07 NOTE — Psych (Signed)
Virtual Visit via Video Note  I connected with Kristin Blanchard on 05/16/21 at  9:00 AM EST by a video enabled telemedicine application and verified that I am speaking with the correct person using two identifiers.  Location: Patient: patient home Provider: clinical home office   I discussed the limitations of evaluation and management by telemedicine and the availability of in person appointments. The patient expressed understanding and agreed to proceed.  I discussed the assessment and treatment plan with the patient. The patient was provided an opportunity to ask questions and all were answered. The patient agreed with the plan and demonstrated an understanding of the instructions.   The patient was advised to call back or seek an in-person evaluation if the symptoms worsen or if the condition fails to improve as anticipated.  Pt was provided 240 minutes of non-face-to-face time during this encounter.   Donia Guiles, LCSW    [facility] PHP THERAPIST PROGRESS NOTE  Kristin Blanchard [id number]  Session Time: 9:00 - 10:00  Participation Level: Active  Behavioral Response: CasualAlertDepressed  Type of Therapy: Group Therapy  Treatment Goals addressed: Coping  Interventions: CBT, DBT, Supportive, and Reframing  Summary: Clinician led check-in regarding current stressors and situation. Clinician utilized active listening and empathetic response and validated patient emotions. Clinician facilitated processing group on pertinent issues.   Therapist Response: Kristin Blanchard is a 26 y.o. female who presents with depression symptoms. Patient arrived within time allowed and reports that she is feeling "good." Patient rates her mood at a 7 on a scale of 1-10 with 10 being great. Pt reports yesterday was busy for her and she was able to complete errands. Pt shared struggles with her family. Pt reports sleeping well. Pt able to process. Pt engaged in discussion.         Session Time: 10:00 -  11:00   Participation Level: Active   Behavioral Response: CasualAlertDepressed   Type of Therapy: Group Therapy   Treatment Goals addressed: Coping   Interventions: CBT, DBT, Supportive and Reframing   Summary: [name] led discussion on control and the way it impacts our lives. Group members shared struggles and worked to identify the way in which control is contributing to the struggle. [name] utilized CBT thought challenging and the Catch-Challenge-Change model to address their control issues.     Therapist Response:   Pt engaged in discussion and is able to determine ways in which control is an issue for her and brainstormed how to address it in a healthy manner.         Session Time: 11:00- 12:00   Participation Level: Active   Behavioral Response: CasualAlertDepressed   Type of Therapy: Group Therapy   Treatment Goals addressed: Coping   Interventions: CBT, DBT, Supportive and Reframing   Summary: [name] led activity on finding what makes you happy. [name] tasked group members to consider times in which they have been happy in the past, moments in which they have felt less distressed recently, and things that give them any rumblings of joy. [name] worked with pt's to process barriers and to consider one thing for now, not the bigger picture.    Therapist Response:  Pt engaged in discussion and is able to process and come up with one thing thing to do to make herself happy.          Session Time: 12:00- 1:00   Participation Level: Active   Behavioral Response: CasualAlertDepressed   Type of Therapy: Group Therapy   Treatment Goals  addressed: Coping   Interventions: CBT, DBT, Supportive and Reframing   Summary: 12:00-12:50: [name] introduced DBT emotion regulation skill, PLEASE. [name] discussed the importance of taking care of our whole body as a way to aid in stabilizing emotions. Group discussed ways they strugge to manage the elements of PLEASE and how to remove barriers.  12:50  -1:00 Clinician led check-out. Clinician assessed for immediate needs, medication compliance and efficacy, and safety concerns   Therapist Response: 12:00-12:50 Pt engaged in discussion and is able to identify ways to utilize PLEASE skill.  12:50 - 1:00: At check-out, patient rates her mood at a 5 on a scale of 1-10 with 10 being great. Pt reports afternoon plans of doing yoga. Pt demonstrates some progress as evidenced by increased daily activity. Patient denies SI/HI at the end of group.    Suicidal/Homicidal: Nowithout intent/plan  Plan: Pt will discharge from PHP due to meeting treatment goals of decreased depression symptoms, increased emotion regulation, and increased ability to manage symptoms in a healthy manner. Pt has declined IOP and will step down to outpatient therapy/psychiatry. Pt was provided with referrals and will follow-up. Pt and provider are aligned with discharge plan. Pt denies SI/HI at time of discharge.   Diagnosis: Severe episode of recurrent major depressive disorder, without psychotic features (HCC) [F33.2]    1. Severe episode of recurrent major depressive disorder, without psychotic features (HCC)   2. Anxiety and depression       Donia Guiles, LCSW

## 2021-08-08 ENCOUNTER — Telehealth (INDEPENDENT_AMBULATORY_CARE_PROVIDER_SITE_OTHER): Payer: Self-pay | Admitting: Family Medicine

## 2021-08-09 ENCOUNTER — Telehealth (INDEPENDENT_AMBULATORY_CARE_PROVIDER_SITE_OTHER): Payer: Self-pay | Admitting: Family Medicine

## 2021-08-09 ENCOUNTER — Encounter (INDEPENDENT_AMBULATORY_CARE_PROVIDER_SITE_OTHER): Payer: Self-pay

## 2021-08-09 NOTE — Telephone Encounter (Signed)
Prior authorization denied for Trulicity. Patient does not have type 2 diabetes.  ?

## 2021-08-17 MED ORDER — TRULICITY 0.75 MG/0.5ML ~~LOC~~ SOAJ: 0.7500 mg | SUBCUTANEOUS | 0 refills | Status: DC

## 2021-08-30 ENCOUNTER — Ambulatory Visit (INDEPENDENT_AMBULATORY_CARE_PROVIDER_SITE_OTHER): Payer: BLUE CROSS/BLUE SHIELD | Admitting: Family Medicine

## 2021-08-30 ENCOUNTER — Encounter (INDEPENDENT_AMBULATORY_CARE_PROVIDER_SITE_OTHER): Payer: Self-pay | Admitting: Family Medicine

## 2021-08-30 ENCOUNTER — Telehealth (INDEPENDENT_AMBULATORY_CARE_PROVIDER_SITE_OTHER): Payer: Self-pay

## 2021-08-30 VITALS — BP 106/76 | HR 87 | Temp 97.6°F | Ht 60.0 in | Wt 272.0 lb

## 2021-08-30 DIAGNOSIS — Z6841 Body Mass Index (BMI) 40.0 and over, adult: Secondary | ICD-10-CM

## 2021-08-30 DIAGNOSIS — J3089 Other allergic rhinitis: Secondary | ICD-10-CM | POA: Diagnosis not present

## 2021-08-30 DIAGNOSIS — E559 Vitamin D deficiency, unspecified: Secondary | ICD-10-CM | POA: Diagnosis not present

## 2021-08-30 DIAGNOSIS — E669 Obesity, unspecified: Secondary | ICD-10-CM

## 2021-08-30 DIAGNOSIS — R7303 Prediabetes: Secondary | ICD-10-CM

## 2021-08-30 DIAGNOSIS — F3289 Other specified depressive episodes: Secondary | ICD-10-CM

## 2021-08-30 MED ORDER — TRULICITY 0.75 MG/0.5ML ~~LOC~~ SOAJ: 0.7500 mg | SUBCUTANEOUS | 0 refills | Status: DC

## 2021-08-30 MED ORDER — ESCITALOPRAM OXALATE 20 MG PO TABS: 20.0000 mg | ORAL_TABLET | Freq: Every day | ORAL | 0 refills | Status: DC

## 2021-08-30 MED ORDER — VITAMIN D (ERGOCALCIFEROL) 1.25 MG (50000 UNIT) PO CAPS: 50000.0000 [IU] | ORAL_CAPSULE | ORAL | 0 refills | Status: DC

## 2021-08-30 MED ORDER — CETIRIZINE HCL 10 MG PO TABS: 10.0000 mg | ORAL_TABLET | Freq: Every day | ORAL | 0 refills | Status: DC

## 2021-08-30 NOTE — Telephone Encounter (Signed)
Prior authorization has already been started with new insurance for [employer].  ?

## 2021-08-30 NOTE — Telephone Encounter (Signed)
Kristin Blanchard, can you please check PA for Trulicity with new insurance? Thanks ?

## 2021-08-30 NOTE — Telephone Encounter (Signed)
Prior authorization was denied for Trulicity on 08/08/21 with new insurance. Denial letter scanned in media.  ?

## 2021-09-04 ENCOUNTER — Ambulatory Visit: Payer: Self-pay | Admitting: Psychology

## 2021-09-04 NOTE — Progress Notes (Signed)
? ? ? ?Chief Complaint:  ? ?OBESITY ?Kristin Blanchard is here to discuss her progress with her obesity treatment plan along with follow-up of her obesity related diagnoses. Kristin Blanchard is on keeping a food journal and adhering to recommended goals of 1150-1300 calories and 80+ grams of protein daily and states she is following her eating plan approximately 40% of the time. Kristin Blanchard states she is attending Yoga 60 minutes 1 time per week. ? ?Today's visit was #: 16 ?Starting weight: 244 lbs ?Starting date: 06/24/2020 ?Today's weight: 272 lbs ?Today's date: 08/30/2021 ?Total lbs lost to date: 0 ?Total lbs lost since last in-office visit: 0 ? ?Interim History: Kristin Blanchard is currently fasting for Ramadan and switched sleeping schedule due to this. She has finished 1st prescription of Trulicity and has diarrhea and nausea. Journaling is a work in progress. She is committed to breakfast. Kristin Blanchard feels like she can't get into the rhythm of journaling. ? ?Subjective:  ? ?1. Vitamin D deficiency ?Kristin Blanchard is currently on prescription Vit D and notes fatigue. ? ?2. Non-seasonal allergic rhinitis due to other allergic trigger ?Kristin Blanchard is currently on Zyrtec, and her symptoms are well controlled. ? ?3. Prediabetes ?Kristin Blanchard has some nausea initially on Trulicity. Her last A1C was 5.7. ? ?4. Other depression ?Kristin Blanchard is currently taking Seroquel and Lexapro. She increased Seroquel to 150 mg for sleep. ? ?Assessment/Plan:  ? ?1. Vitamin D deficiency ?We will refill prescription Vit D 50k for 1 month with no refills. ? ?- Refill Vitamin D, Ergocalciferol, (DRISDOL) 1.25 MG (50000 UNIT) CAPS capsule; Take 1 capsule (50,000 Units total) by mouth every 7 (seven) days.  Dispense: 4 capsule; Refill: 0 ? ?2. Non-seasonal allergic rhinitis due to other allergic trigger ?We will refill Zyrtec 10 mg for 3 months with no refills. ? ?- Refill cetirizine (ZYRTEC) 10 MG tablet; Take 1 tablet (10 mg total) by mouth daily.  Dispense: 90 tablet; Refill: 0 ? ?3. Prediabetes ?We will refill  Trulicity 0.75 mg for 1 month with no refills. ? ?- Refill Dulaglutide (TRULICITY) 0.75 MG/0.5ML SOPN; Inject 0.75 mg into the skin once a week.  Dispense: 2 mL; Refill: 0 ? ?4. Other depression ?We will refill Lexapro 20 mg for 3 months with no refills. ? ?- escitalopram (LEXAPRO) 20 MG tablet; Take 1 tablet (20 mg total) by mouth daily.  Dispense: 90 tablet; Refill: 0 ? ?5. Obesity with current BMI of 53.2 ?Kristin Blanchard is currently in the action stage of change. As such, her goal is to continue with weight loss efforts. She has agreed to keeping a food journal and adhering to recommended goals of 1150-1300 calories and 80+ grams of protein daily. ? ?Exercise goals: All adults should avoid inactivity. Some physical activity is better than none, and adults who participate in any amount of physical activity gain some health benefits. ? ?Behavioral modification strategies: increasing lean protein intake, meal planning and cooking strategies, keeping healthy foods in the home, and keeping a strict food journal. ? ?Kristin Blanchard has agreed to follow-up with our clinic in 4 weeks. She was informed of the importance of frequent follow-up visits to maximize her success with intensive lifestyle modifications for her multiple health conditions.  ? ? ?Objective:  ? ?Blood pressure 106/76, pulse 87, temperature 97.6 ?F (36.4 ?C), height 5' (1.524 m), weight 272 lb (123.4 kg), last menstrual period 08/16/2021, SpO2 98 %. ?Body mass index is 53.12 kg/m?. ? ?General: Cooperative, alert, well developed, in no acute distress. ?HEENT: Conjunctivae and lids unremarkable. ?Cardiovascular: Regular rhythm.  ?  Lungs: Normal work of breathing. ?Neurologic: No focal deficits.  ? ?Lab Results  ?Component Value Date  ? CREATININE 0.69 05/11/2021  ? BUN 8 05/11/2021  ? NA 140 05/11/2021  ? K 4.5 05/11/2021  ? CL 102 05/11/2021  ? CO2 23 05/11/2021  ? ?Lab Results  ?Component Value Date  ? ALT 18 05/11/2021  ? AST 17 05/11/2021  ? ALKPHOS 89 05/11/2021  ?  BILITOT <0.2 05/11/2021  ? ?Lab Results  ?Component Value Date  ? HGBA1C 5.7 (H) 05/11/2021  ? HGBA1C 5.5 07/25/2020  ? HGBA1C 5.2 08/10/2016  ? HGBA1C 5.1 03/09/2016  ? HGBA1C 5.2 11/15/2014  ? ?Lab Results  ?Component Value Date  ? INSULIN 20.2 05/11/2021  ? INSULIN 23.1 07/25/2020  ? ?Lab Results  ?Component Value Date  ? TSH 1.510 07/25/2020  ? ?Lab Results  ?Component Value Date  ? CHOL 187 05/11/2021  ? HDL 69 05/11/2021  ? LDLCALC 104 (H) 05/11/2021  ? TRIG 74 05/11/2021  ? CHOLHDL 2.4 03/09/2016  ? ?Lab Results  ?Component Value Date  ? VD25OH 29.9 (L) 05/11/2021  ? VD25OH 17.0 (L) 07/25/2020  ? VD25OH 14 (L) 05/07/2016  ? ?Lab Results  ?Component Value Date  ? WBC 4.0 07/25/2020  ? HGB 12.6 07/25/2020  ? HCT 38.9 07/25/2020  ? MCV 90 07/25/2020  ? PLT 361 07/25/2020  ? ?No results found for: IRON, TIBC, FERRITIN ? ?Attestation Statements:  ? ?Reviewed by clinician on day of visit: allergies, medications, problem list, medical history, surgical history, family history, social history, and previous encounter notes. ? ?I, Brendell Tyus, am acting as transcriptionist for Reuben Likes, MD. ? ?I have reviewed the above documentation for accuracy and completeness, and I agree with the above. Reuben Likes, MD ? ?

## 2021-09-26 ENCOUNTER — Ambulatory Visit: Payer: 59 | Admitting: Psychology

## 2021-09-27 ENCOUNTER — Ambulatory Visit: Payer: 59 | Admitting: Psychology

## 2021-09-27 ENCOUNTER — Ambulatory Visit (INDEPENDENT_AMBULATORY_CARE_PROVIDER_SITE_OTHER): Payer: BLUE CROSS/BLUE SHIELD | Admitting: Family Medicine

## 2021-09-28 ENCOUNTER — Encounter (INDEPENDENT_AMBULATORY_CARE_PROVIDER_SITE_OTHER): Payer: Self-pay | Admitting: Family Medicine

## 2021-09-28 ENCOUNTER — Ambulatory Visit (INDEPENDENT_AMBULATORY_CARE_PROVIDER_SITE_OTHER): Payer: BLUE CROSS/BLUE SHIELD | Admitting: Family Medicine

## 2021-09-28 VITALS — BP 99/64 | HR 96 | Temp 97.7°F | Ht 60.0 in | Wt 275.0 lb

## 2021-09-28 DIAGNOSIS — E559 Vitamin D deficiency, unspecified: Secondary | ICD-10-CM

## 2021-09-28 DIAGNOSIS — E669 Obesity, unspecified: Secondary | ICD-10-CM | POA: Diagnosis not present

## 2021-09-28 DIAGNOSIS — Z9189 Other specified personal risk factors, not elsewhere classified: Secondary | ICD-10-CM

## 2021-09-28 DIAGNOSIS — Z6841 Body Mass Index (BMI) 40.0 and over, adult: Secondary | ICD-10-CM | POA: Diagnosis not present

## 2021-09-28 DIAGNOSIS — R0602 Shortness of breath: Secondary | ICD-10-CM | POA: Diagnosis not present

## 2021-09-28 MED ORDER — ALBUTEROL SULFATE HFA 108 (90 BASE) MCG/ACT IN AERS: INHALATION_SPRAY | RESPIRATORY_TRACT | 1 refills | Status: AC

## 2021-09-28 MED ORDER — VITAMIN D (ERGOCALCIFEROL) 1.25 MG (50000 UNIT) PO CAPS: 50000.0000 [IU] | ORAL_CAPSULE | ORAL | 0 refills | Status: AC

## 2021-10-09 NOTE — Progress Notes (Signed)
Chief Complaint:   OBESITY Kristin Blanchard is here to discuss her progress with her obesity treatment plan along with follow-up of her obesity related diagnoses. [name] is on keeping a food journal and adhering to recommended goals of 1150-1300 calories and 80 protein and states she is following her eating plan approximately 10% of the time. [name] states she is not doing any exercise 0 minutes 0 times per week.  Today's visit was #: 17 Starting weight: 244 lbs Starting date: 06/24/2020 Today's weight: 272 lbs Today's date: 09/28/2021 Total lbs lost to date: 0 Total lbs lost since last in-office visit: 0  Interim History: [name] just finished Ramadan last week, so was not able to follow meal plan as strictly as she would have liked to. [name] is worried she won't be able to have successful with continued weight loss.  She knows she can be successful.  [name] is going to [place] next week for a concert.  Subjective:   1. Vitamin D deficiency [name] is on prescription Vitamin D, her last Vitamin D level was 29.9, but still complains of fatigue.  2. SOB (shortness of breath) on exertion [name] is on albuterol with good control of symptoms.  [name] is noticing her symptoms are worsening with activity and allergies.   3. At risk for osteoporosis [name] is at higher risk of osteopenia and osteoporosis due to Vitamin D deficiency.    Assessment/Plan:   1. Vitamin D deficiency [name] has agreed to continue prescription Vitamin D.  We will refill today.  See below.   - Vitamin D, Ergocalciferol, (DRISDOL) 1.25 MG (50000 UNIT) CAPS capsule; Take 1 capsule (50,000 Units total) by mouth every 7 (seven) days.  Dispense: 4 capsule; Refill: 0  2. SOB (shortness of breath) on exertion Kathyann has agreed to continue albuterol.  We will refill today.  See below.  - albuterol (VENTOLIN HFA) 108 (90 Base) MCG/ACT inhaler; Inhale 2 puffs into the lungs every 4 hours as needed to treat wheezes, cough, shortness of breath   Dispense: 1 each; Refill: 1  3. At risk for osteoporosis Majorie was given approximately 15 minutes of osteoporosis prevention counseling today. [name] is at risk for osteopenia and osteoporosis due to her Vitamin D deficiency. She was encouraged to take her Vit D and follow her higher calcium diet and increase strengthening exercise to help strengthen her bones and decrease her risk of osteopenia and osteoporosis.   4. Obesity with current BMI of 53.9 [name] is currently in the action stage of change. As such, her goal is to continue with weight loss efforts. She has agreed to keeping a food journal and adhering to recommended goals of 1150-1300 calories and 80+ protein daily.   Exercise goals:  As is.   Behavioral modification strategies: increasing lean protein intake, meal planning and cooking strategies, and keeping healthy foods in the home.  [name] has agreed to follow-up with our clinic in 4 weeks. She was informed of the importance of frequent follow-up visits to maximize her success with intensive lifestyle modifications for her multiple health conditions.   Objective:   Blood pressure 99/64, pulse 96, temperature 97.7 F (36.5 C), height 5' (1.524 m), weight 275 lb (124.7 kg), last menstrual period 08/16/2021, SpO2 98 %. Body mass index is 53.71 kg/m.  General: Cooperative, alert, well developed, in no acute distress. HEENT: Conjunctivae and lids unremarkable. Cardiovascular: Regular rhythm.  Lungs: Normal work of breathing. Neurologic: No focal deficits.   Lab Results  Component Value  Date   CREATININE 0.69 05/11/2021   BUN 8 05/11/2021   NA 140 05/11/2021   K 4.5 05/11/2021   CL 102 05/11/2021   CO2 23 05/11/2021   Lab Results  Component Value Date   ALT 18 05/11/2021   AST 17 05/11/2021   ALKPHOS 89 05/11/2021   BILITOT <0.2 05/11/2021   Lab Results  Component Value Date   HGBA1C 5.7 (H) 05/11/2021   HGBA1C 5.5 07/25/2020   HGBA1C 5.2 08/10/2016   HGBA1C 5.1  03/09/2016   HGBA1C 5.2 11/15/2014   Lab Results  Component Value Date   INSULIN 20.2 05/11/2021   INSULIN 23.1 07/25/2020   Lab Results  Component Value Date   TSH 1.510 07/25/2020   Lab Results  Component Value Date   CHOL 187 05/11/2021   HDL 69 05/11/2021   LDLCALC 104 (H) 05/11/2021   TRIG 74 05/11/2021   CHOLHDL 2.4 03/09/2016   Lab Results  Component Value Date   VD25OH 29.9 (L) 05/11/2021   VD25OH 17.0 (L) 07/25/2020   VD25OH 14 (L) 05/07/2016   Lab Results  Component Value Date   WBC 4.0 07/25/2020   HGB 12.6 07/25/2020   HCT 38.9 07/25/2020   MCV 90 07/25/2020   PLT 361 07/25/2020   No results found for: IRON, TIBC, FERRITIN  Attestation Statements:   Reviewed by clinician on day of visit: allergies, medications, problem list, medical history, surgical history, family history, social history, and previous encounter notes.  I, Malcolm Metro, RMA, am acting as transcriptionist for Reuben Likes, MD.  I have reviewed the above documentation for accuracy and completeness, and I agree with the above. - Reuben Likes, MD

## 2021-10-26 ENCOUNTER — Ambulatory Visit (INDEPENDENT_AMBULATORY_CARE_PROVIDER_SITE_OTHER): Payer: BLUE CROSS/BLUE SHIELD | Admitting: Family Medicine

## 2021-10-26 ENCOUNTER — Encounter (INDEPENDENT_AMBULATORY_CARE_PROVIDER_SITE_OTHER): Payer: Self-pay | Admitting: Family Medicine

## 2021-10-26 VITALS — BP 109/72 | HR 81 | Temp 97.6°F | Ht 60.0 in | Wt 278.0 lb

## 2021-10-26 DIAGNOSIS — Z6841 Body Mass Index (BMI) 40.0 and over, adult: Secondary | ICD-10-CM

## 2021-10-26 DIAGNOSIS — F3289 Other specified depressive episodes: Secondary | ICD-10-CM

## 2021-10-26 DIAGNOSIS — N63 Unspecified lump in unspecified breast: Secondary | ICD-10-CM

## 2021-10-26 DIAGNOSIS — J3089 Other allergic rhinitis: Secondary | ICD-10-CM

## 2021-10-26 DIAGNOSIS — E669 Obesity, unspecified: Secondary | ICD-10-CM

## 2021-10-26 MED ORDER — CETIRIZINE HCL 10 MG PO TABS: 10.0000 mg | ORAL_TABLET | Freq: Every day | ORAL | 0 refills | Status: AC

## 2021-10-26 MED ORDER — ESCITALOPRAM OXALATE 20 MG PO TABS: 20.0000 mg | ORAL_TABLET | Freq: Every day | ORAL | 0 refills | Status: AC

## 2021-11-02 NOTE — Progress Notes (Signed)
Chief Complaint:   OBESITY Kristin Blanchard is here to discuss her progress with her obesity treatment plan along with follow-up of her obesity related diagnoses. Kristin Blanchard is on keeping a food journal and adhering to recommended goals of 1150-1300 calories and 80+ grams of protein and states she is following her eating plan approximately 40% of the time. Kristin Blanchard states she is group walking 90-120 minutes 3 times per week.  Today's visit was #: 18 Starting weight: 244 lbs Starting date: 06/24/2020 Today's weight: 278 lbs Today's date: 10/26/2021 Total lbs lost to date: 0 lbs Total lbs lost since last in-office visit: 0  Interim History: Kristin Blanchard is trying to get life back on track. Her mom just got Ozempic and she is asking about that medication. Foodwise she has been consistent and some emotional reasons for her eating.  Kristin Blanchard unfortunately has not been logging food. She is going to [place] for the weekend.   Subjective:   1. Non-seasonal allergic rhinitis due to other allergic trigger Latausha is currently taking Zyrtec with good symptom control. Occasionally she needs Ventolin for asthma exacerbation.  2. Mass of breast, unspecified laterality Tayelor reports a breast lump when self examined.   3. Other depression Orah is currently taking Lexapro and Seroquel. She sees psychiatry and counselor.  Assessment/Plan:   1. Non-seasonal allergic rhinitis due to other allergic trigger We will refill Zyrtec 10 mg by mouth daily for 3 months with no refills.  -Refill cetirizine (ZYRTEC) 10 MG tablet; Take 1 tablet (10 mg total) by mouth daily.  Dispense: 90 tablet; Refill: 0  2. Mass of breast, unspecified laterality We will refer Kristin Blanchard to GYN as she does currently have a PCP and can likely get in faster to GYN.  - Ambulatory referral to Gynecology  3. Other depression We will refill Lexapro 20 mg by mouth daily for 3 months with no refills.  -Refill escitalopram (LEXAPRO) 20 MG tablet; Take 1 tablet (20  mg total) by mouth daily.  Dispense: 90 tablet; Refill: 0  4. Obesity with current BMI of 54.3 Kristin Blanchard is currently in the action stage of change. As such, her goal is to continue with weight loss efforts. She has agreed to keeping a food journal and adhering to recommended goals of 1150-1300 calories and 80+ grams of protein.   Exercise goals: All adults should avoid inactivity. Some physical activity is better than none, and adults who participate in any amount of physical activity gain some health benefits.  Behavioral modification strategies: increasing lean protein intake, meal planning and cooking strategies, and keeping healthy foods in the home.  Alveda has agreed to follow-up with our clinic in 4 weeks. She was informed of the importance of frequent follow-up visits to maximize her success with intensive lifestyle modifications for her multiple health conditions.   Objective:   Blood pressure 109/72, pulse 81, temperature 97.6 F (36.4 C), height 5' (1.524 m), weight 278 lb (126.1 kg), SpO2 98 %. Body mass index is 54.29 kg/m.  General: Cooperative, alert, well developed, in no acute distress. HEENT: Conjunctivae and lids unremarkable. Cardiovascular: Regular rhythm.  Lungs: Normal work of breathing. Neurologic: No focal deficits.   Lab Results  Component Value Date   CREATININE 0.69 05/11/2021   BUN 8 05/11/2021   NA 140 05/11/2021   K 4.5 05/11/2021   CL 102 05/11/2021   CO2 23 05/11/2021   Lab Results  Component Value Date   ALT 18 05/11/2021   AST 17 05/11/2021  ALKPHOS 89 05/11/2021   BILITOT <0.2 05/11/2021   Lab Results  Component Value Date   HGBA1C 5.7 (H) 05/11/2021   HGBA1C 5.5 07/25/2020   HGBA1C 5.2 08/10/2016   HGBA1C 5.1 03/09/2016   HGBA1C 5.2 11/15/2014   Lab Results  Component Value Date   INSULIN 20.2 05/11/2021   INSULIN 23.1 07/25/2020   Lab Results  Component Value Date   TSH 1.510 07/25/2020   Lab Results  Component Value Date    CHOL 187 05/11/2021   HDL 69 05/11/2021   LDLCALC 104 (H) 05/11/2021   TRIG 74 05/11/2021   CHOLHDL 2.4 03/09/2016   Lab Results  Component Value Date   VD25OH 29.9 (L) 05/11/2021   VD25OH 17.0 (L) 07/25/2020   VD25OH 14 (L) 05/07/2016   Lab Results  Component Value Date   WBC 4.0 07/25/2020   HGB 12.6 07/25/2020   HCT 38.9 07/25/2020   MCV 90 07/25/2020   PLT 361 07/25/2020   No results found for: IRON, TIBC, FERRITIN  Attestation Statements:   Reviewed by clinician on day of visit: allergies, medications, problem list, medical history, surgical history, family history, social history, and previous encounter notes.  I, Fortino Sic, RMA am acting as transcriptionist for Reuben Likes, MD.  I have reviewed the above documentation for accuracy and completeness, and I agree with the above. - Reuben Likes, MD

## 2021-11-27 ENCOUNTER — Ambulatory Visit (INDEPENDENT_AMBULATORY_CARE_PROVIDER_SITE_OTHER): Payer: BLUE CROSS/BLUE SHIELD | Admitting: Family Medicine

## 2021-11-27 ENCOUNTER — Encounter (INDEPENDENT_AMBULATORY_CARE_PROVIDER_SITE_OTHER): Payer: Self-pay | Admitting: Family Medicine

## 2021-11-27 VITALS — BP 118/86 | HR 82 | Temp 97.7°F | Ht 60.0 in | Wt 282.6 lb

## 2021-11-27 DIAGNOSIS — E669 Obesity, unspecified: Secondary | ICD-10-CM | POA: Diagnosis not present

## 2021-11-27 DIAGNOSIS — E559 Vitamin D deficiency, unspecified: Secondary | ICD-10-CM | POA: Diagnosis not present

## 2021-11-27 DIAGNOSIS — Z6841 Body Mass Index (BMI) 40.0 and over, adult: Secondary | ICD-10-CM

## 2021-11-27 DIAGNOSIS — F419 Anxiety disorder, unspecified: Secondary | ICD-10-CM | POA: Diagnosis not present

## 2021-11-27 DIAGNOSIS — F32A Depression, unspecified: Secondary | ICD-10-CM | POA: Diagnosis not present

## 2021-11-29 NOTE — Progress Notes (Signed)
Chief Complaint:   OBESITY Kristin Blanchard is here to discuss her progress with her obesity treatment plan along with follow-up of her obesity related diagnoses. Kristin Blanchard is on keeping a food journal and adhering to recommended goals of 1150-1300 calories and 80+ grams of protein and states she is following her eating plan approximately 30% of the time. Kristin Blanchard states she is doing yoga and swimming 60-120 minutes 1 times per week.  Today's visit was #: 19 Starting weight: 244 lbs Starting date: 06/24/2020 Today's weight: 282 lbs Today's date: 11/27/2021 Total lbs lost to date: 0 lbs Total lbs lost since last in-office visit: 0  Interim History: Kristin Blanchard just returned from a work trip to [place]. She leaves July 6 for a trip to [place]. Foodwise she has not been as compliant on meal plan as she would have wanted to be. She has not started Ozempic yet. Wants to really get serious about logging and committing to structured plan. Feels like pictures from [facility] of her were eye opening.  Subjective:   1. Vitamin D deficiency Kristin Blanchard is currently taking prescription Vit D 50,000 IU once a week. Denies any nausea, vomiting or muscle weakness.  She notes fatigue.  2. Anxiety and depression Kristin Blanchard is feeling much much better on Lexapro, Buspar and Seroquel.  Assessment/Plan:   1. Vitamin D deficiency Kristin Blanchard will continue prescription Vit D with no changes in dose.  2. Anxiety and depression Will follow up on symptoms at next appointment.  3. Obesity with current BMI of 55.2 Kristin Blanchard is currently in the action stage of change. As such, her goal is to continue with weight loss efforts. She has agreed to keeping a food journal and adhering to recommended goals of 1150-1300 calories and 80+ grams of protein daily.  Exercise goals: All adults should avoid inactivity. Some physical activity is better than none, and adults who participate in any amount of physical activity gain some health benefits.  Behavioral modification  strategies: increasing lean protein intake, meal planning and cooking strategies, keeping healthy foods in the home, and planning for success.  Kristin Blanchard has agreed to follow-up with our clinic in 5 weeks. She was informed of the importance of frequent follow-up visits to maximize her success with intensive lifestyle modifications for her multiple health conditions.   Objective:   Blood pressure 118/86, pulse 82, temperature 97.7 F (36.5 C), height 5' (1.524 m), weight 282 lb 9.6 oz (128.2 kg), SpO2 98 %. Body mass index is 55.19 kg/m.  General: Cooperative, alert, well developed, in no acute distress. HEENT: Conjunctivae and lids unremarkable. Cardiovascular: Regular rhythm.  Lungs: Normal work of breathing. Neurologic: No focal deficits.   Lab Results  Component Value Date   CREATININE 0.69 05/11/2021   BUN 8 05/11/2021   NA 140 05/11/2021   K 4.5 05/11/2021   CL 102 05/11/2021   CO2 23 05/11/2021   Lab Results  Component Value Date   ALT 18 05/11/2021   AST 17 05/11/2021   ALKPHOS 89 05/11/2021   BILITOT <0.2 05/11/2021   Lab Results  Component Value Date   HGBA1C 5.7 (H) 05/11/2021   HGBA1C 5.5 07/25/2020   HGBA1C 5.2 08/10/2016   HGBA1C 5.1 03/09/2016   HGBA1C 5.2 11/15/2014   Lab Results  Component Value Date   INSULIN 20.2 05/11/2021   INSULIN 23.1 07/25/2020   Lab Results  Component Value Date   TSH 1.510 07/25/2020   Lab Results  Component Value Date   CHOL 187  05/11/2021   HDL 69 05/11/2021   LDLCALC 104 (H) 05/11/2021   TRIG 74 05/11/2021   CHOLHDL 2.4 03/09/2016   Lab Results  Component Value Date   VD25OH 29.9 (L) 05/11/2021   VD25OH 17.0 (L) 07/25/2020   VD25OH 14 (L) 05/07/2016   Lab Results  Component Value Date   WBC 4.0 07/25/2020   HGB 12.6 07/25/2020   HCT 38.9 07/25/2020   MCV 90 07/25/2020   PLT 361 07/25/2020   No results found for: "IRON", "TIBC", "FERRITIN"  Attestation Statements:   Reviewed by clinician on day of  visit: allergies, medications, problem list, medical history, surgical history, family history, social history, and previous encounter notes.  I, Fortino Sic, RMA am acting as transcriptionist for Reuben Likes, MD.  I have reviewed the above documentation for accuracy and completeness, and I agree with the above. - Reuben Likes, MD

## 2021-12-06 ENCOUNTER — Other Ambulatory Visit (INDEPENDENT_AMBULATORY_CARE_PROVIDER_SITE_OTHER): Payer: Self-pay | Admitting: Family Medicine

## 2021-12-06 DIAGNOSIS — F3289 Other specified depressive episodes: Secondary | ICD-10-CM

## 2021-12-06 NOTE — Telephone Encounter (Signed)
Patient leaving out of town on 12/07/21 patient wants to make sure she can get this sent in before she leaves.

## 2021-12-07 NOTE — Telephone Encounter (Signed)
Mailbox is full 12/07/21 10:28

## 2021-12-15 ENCOUNTER — Encounter (INDEPENDENT_AMBULATORY_CARE_PROVIDER_SITE_OTHER): Payer: Self-pay

## 2022-01-08 ENCOUNTER — Ambulatory Visit (INDEPENDENT_AMBULATORY_CARE_PROVIDER_SITE_OTHER): Payer: BLUE CROSS/BLUE SHIELD | Admitting: Family Medicine

## 2022-01-10 ENCOUNTER — Encounter (INDEPENDENT_AMBULATORY_CARE_PROVIDER_SITE_OTHER): Payer: Self-pay

## 2023-05-15 ENCOUNTER — Encounter: Payer: Self-pay | Admitting: Physician Assistant

## 2023-05-15 ENCOUNTER — Ambulatory Visit: Payer: Self-pay | Admitting: Physician Assistant

## 2023-05-15 ENCOUNTER — Other Ambulatory Visit (INDEPENDENT_AMBULATORY_CARE_PROVIDER_SITE_OTHER): Payer: Self-pay

## 2023-05-15 DIAGNOSIS — M25511 Pain in right shoulder: Secondary | ICD-10-CM | POA: Insufficient documentation

## 2023-05-15 MED ORDER — MELOXICAM 15 MG PO TABS: 15.0000 mg | ORAL_TABLET | Freq: Every day | ORAL | 0 refills | Status: DC
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
## 2023-06-04 ENCOUNTER — Ambulatory Visit: Payer: 59 | Admitting: Physician Assistant

## 2023-06-06 ENCOUNTER — Encounter: Payer: Self-pay | Admitting: Physician Assistant

## 2023-06-06 ENCOUNTER — Ambulatory Visit: Payer: BC Managed Care – PPO | Admitting: Physician Assistant

## 2023-06-06 DIAGNOSIS — M25511 Pain in right shoulder: Secondary | ICD-10-CM

## 2023-06-06 DIAGNOSIS — M542 Cervicalgia: Secondary | ICD-10-CM
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
## 2023-06-11 ENCOUNTER — Other Ambulatory Visit: Payer: Self-pay | Admitting: Physician Assistant

## 2023-06-17 ENCOUNTER — Ambulatory Visit: Payer: 59 | Admitting: Rehabilitative and Restorative Service Providers"

## 2023-06-17 ENCOUNTER — Encounter: Payer: Self-pay | Admitting: Physical Therapy

## 2023-06-17 ENCOUNTER — Ambulatory Visit (INDEPENDENT_AMBULATORY_CARE_PROVIDER_SITE_OTHER): Payer: BC Managed Care – PPO | Admitting: Physical Therapy

## 2023-06-17 DIAGNOSIS — M6281 Muscle weakness (generalized): Secondary | ICD-10-CM

## 2023-06-17 DIAGNOSIS — M25511 Pain in right shoulder: Secondary | ICD-10-CM | POA: Diagnosis not present

## 2023-06-17 DIAGNOSIS — M542 Cervicalgia: Secondary | ICD-10-CM
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
## 2023-06-24 ENCOUNTER — Ambulatory Visit (INDEPENDENT_AMBULATORY_CARE_PROVIDER_SITE_OTHER): Payer: BC Managed Care – PPO | Admitting: Physical Therapy

## 2023-06-24 ENCOUNTER — Encounter: Payer: Self-pay | Admitting: Physical Therapy

## 2023-06-24 DIAGNOSIS — M542 Cervicalgia: Secondary | ICD-10-CM | POA: Diagnosis not present

## 2023-06-24 DIAGNOSIS — M6281 Muscle weakness (generalized): Secondary | ICD-10-CM

## 2023-06-24 DIAGNOSIS — M25511 Pain in right shoulder: Secondary | ICD-10-CM
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
## 2023-07-01 ENCOUNTER — Encounter: Payer: Self-pay | Admitting: Physical Therapy

## 2023-07-01 ENCOUNTER — Ambulatory Visit (INDEPENDENT_AMBULATORY_CARE_PROVIDER_SITE_OTHER): Payer: BC Managed Care – PPO | Admitting: Physical Therapy

## 2023-07-01 DIAGNOSIS — M542 Cervicalgia: Secondary | ICD-10-CM | POA: Diagnosis not present

## 2023-07-01 DIAGNOSIS — M6281 Muscle weakness (generalized): Secondary | ICD-10-CM | POA: Diagnosis not present

## 2023-07-01 DIAGNOSIS — M25511 Pain in right shoulder: Secondary | ICD-10-CM | POA: Diagnosis not present
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
## 2023-07-01 NOTE — Patient Instructions (Signed)
Trigger Point Dry Needling  What is Trigger Point Dry Needling (DN)? DN is a physical therapy technique used to treat muscle pain and dysfunction. Specifically, DN helps deactivate muscle trigger points (muscle knots).  A thin filiform needle is used to penetrate the skin and stimulate the underlying trigger point. The goal is for a local twitch response (LTR) to occur and for the trigger point to relax. No medication of any kind is injected during the procedure.   What Does Trigger Point Dry Needling Feel Like?  The procedure feels different for each individual patient. Some patients report that they do not actually feel the needle enter the skin and overall the process is not painful. Very mild bleeding may occur. However, many patients feel a deep cramping in the muscle in which the needle was inserted. This is the local twitch response.   How Will I feel after the treatment? Soreness is normal, and the onset of soreness may not occur for a few hours. Typically this soreness does not last longer than two days.  Bruising is uncommon, however; ice can be used to decrease any possible bruising.  In rare cases feeling tired or nauseous after the treatment is normal. In addition, your symptoms may get worse before they get better, this period will typically not last longer than 24 hours.   What Can I do After My Treatment? Increase your hydration by drinking more water for the next 24 hours.  You may place ice or heat on the areas treated that have become sore, however, do not use heat on inflamed or bruised areas. Heat often brings more relief post needling. You can continue your regular activities, but vigorous activity is not recommended initially after the treatment for 24 hours. DN is best combined with other physical therapy such as strengthening, stretching, and other therapies.   What are the complications? While your therapist has had extensive training in minimizing the risks of trigger  point dry needling, it is important to understand the risks of any procedure.  Risks include bleeding, pain, fatigue, hematoma, infection, vertigo, nausea or nerve involvement. Monitor for any changes to your skin or sensation. Contact your therapist or MD with concerns.  A rare but serious complication is a pneumothorax over or near your middle and upper chest and back If you have dry needling in this area, monitor for the following symptoms: Shortness of breath on exertion and/or Difficulty taking a deep breath and/or Chest Pain and/or A dry cough If any of the above symptoms develop, please go to the nearest emergency room or call 911. Tell them you had dry needling over your thorax and report any symptoms you are having. Please follow-up with your treating therapist after you complete the medical evaluation. ed

## 2023-07-08 ENCOUNTER — Other Ambulatory Visit: Payer: Self-pay | Admitting: Physician Assistant

## 2023-07-08 ENCOUNTER — Ambulatory Visit (INDEPENDENT_AMBULATORY_CARE_PROVIDER_SITE_OTHER): Payer: BC Managed Care – PPO | Admitting: Physical Therapy

## 2023-07-08 ENCOUNTER — Encounter: Payer: Self-pay | Admitting: Physical Therapy

## 2023-07-08 DIAGNOSIS — M6281 Muscle weakness (generalized): Secondary | ICD-10-CM

## 2023-07-08 DIAGNOSIS — M542 Cervicalgia: Secondary | ICD-10-CM | POA: Diagnosis not present

## 2023-07-08 DIAGNOSIS — M25511 Pain in right shoulder: Secondary | ICD-10-CM
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
## 2023-07-09 ENCOUNTER — Encounter: Payer: Self-pay | Admitting: Orthopaedic Surgery

## 2023-07-09 ENCOUNTER — Other Ambulatory Visit (INDEPENDENT_AMBULATORY_CARE_PROVIDER_SITE_OTHER): Payer: Self-pay

## 2023-07-09 ENCOUNTER — Ambulatory Visit: Payer: BC Managed Care – PPO | Admitting: Orthopaedic Surgery

## 2023-07-09 VITALS — BP 129/95 | Temp 93.0°F

## 2023-07-09 DIAGNOSIS — M542 Cervicalgia: Secondary | ICD-10-CM
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
## 2023-07-17 ENCOUNTER — Ambulatory Visit (INDEPENDENT_AMBULATORY_CARE_PROVIDER_SITE_OTHER): Payer: BC Managed Care – PPO | Admitting: Physical Therapy

## 2023-07-17 ENCOUNTER — Encounter: Payer: Self-pay | Admitting: Physical Therapy

## 2023-07-17 DIAGNOSIS — M25511 Pain in right shoulder: Secondary | ICD-10-CM | POA: Diagnosis not present

## 2023-07-17 DIAGNOSIS — M6281 Muscle weakness (generalized): Secondary | ICD-10-CM

## 2023-07-17 DIAGNOSIS — M542 Cervicalgia: Secondary | ICD-10-CM
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
## 2023-07-22 ENCOUNTER — Encounter: Payer: BC Managed Care – PPO | Admitting: Physical Therapy

## 2023-07-29 ENCOUNTER — Encounter: Payer: Self-pay | Admitting: Physical Therapy

## 2023-07-29 ENCOUNTER — Ambulatory Visit (INDEPENDENT_AMBULATORY_CARE_PROVIDER_SITE_OTHER): Payer: BC Managed Care – PPO | Admitting: Physical Therapy

## 2023-07-29 DIAGNOSIS — M25511 Pain in right shoulder: Secondary | ICD-10-CM | POA: Diagnosis not present

## 2023-07-29 DIAGNOSIS — M542 Cervicalgia: Secondary | ICD-10-CM | POA: Diagnosis not present

## 2023-07-29 DIAGNOSIS — M6281 Muscle weakness (generalized): Secondary | ICD-10-CM
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
## 2023-07-30 ENCOUNTER — Ambulatory Visit (INDEPENDENT_AMBULATORY_CARE_PROVIDER_SITE_OTHER): Payer: BC Managed Care – PPO | Admitting: Orthopaedic Surgery

## 2023-07-30 DIAGNOSIS — M47812 Spondylosis without myelopathy or radiculopathy, cervical region: Secondary | ICD-10-CM

## 2023-07-30 DIAGNOSIS — M792 Neuralgia and neuritis, unspecified: Secondary | ICD-10-CM | POA: Diagnosis not present

## 2023-07-30 DIAGNOSIS — M542 Cervicalgia: Secondary | ICD-10-CM | POA: Diagnosis not present
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
## 2023-08-01 DIAGNOSIS — M792 Neuralgia and neuritis, unspecified: Secondary | ICD-10-CM | POA: Insufficient documentation

## 2023-08-05 ENCOUNTER — Encounter: Payer: Self-pay | Admitting: Physical Therapy

## 2023-08-05 ENCOUNTER — Ambulatory Visit (INDEPENDENT_AMBULATORY_CARE_PROVIDER_SITE_OTHER): Payer: BC Managed Care – PPO | Admitting: Physical Therapy

## 2023-08-05 DIAGNOSIS — M542 Cervicalgia: Secondary | ICD-10-CM

## 2023-08-05 DIAGNOSIS — M6281 Muscle weakness (generalized): Secondary | ICD-10-CM

## 2023-08-05 DIAGNOSIS — M25511 Pain in right shoulder: Secondary | ICD-10-CM
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
## 2023-08-06 ENCOUNTER — Encounter: Payer: Self-pay | Admitting: Orthopaedic Surgery

## 2023-08-06 ENCOUNTER — Ambulatory Visit: Payer: BC Managed Care – PPO | Admitting: Orthopaedic Surgery

## 2023-08-17 ENCOUNTER — Ambulatory Visit
Admission: RE | Admit: 2023-08-17 | Discharge: 2023-08-17 | Disposition: A | Discharge status: Home / Self Care | Payer: BC Managed Care – PPO | Source: Ambulatory Visit | Attending: Orthopaedic Surgery | Admitting: Orthopaedic Surgery

## 2023-08-17 DIAGNOSIS — M542 Cervicalgia: Secondary | ICD-10-CM

## 2023-08-19 ENCOUNTER — Encounter: Payer: Self-pay | Admitting: Physical Therapy

## 2023-08-19 ENCOUNTER — Ambulatory Visit (INDEPENDENT_AMBULATORY_CARE_PROVIDER_SITE_OTHER): Admitting: Physical Therapy

## 2023-08-19 DIAGNOSIS — M6281 Muscle weakness (generalized): Secondary | ICD-10-CM | POA: Diagnosis not present

## 2023-08-19 DIAGNOSIS — M25511 Pain in right shoulder: Secondary | ICD-10-CM

## 2023-08-19 DIAGNOSIS — M542 Cervicalgia: Secondary | ICD-10-CM | POA: Diagnosis not present
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
## 2023-08-27 ENCOUNTER — Encounter: Payer: Self-pay | Admitting: Orthopaedic Surgery

## 2023-08-27 ENCOUNTER — Ambulatory Visit: Admitting: Orthopaedic Surgery

## 2023-08-27 VITALS — BP 116/85 | HR 108

## 2023-08-27 DIAGNOSIS — M25511 Pain in right shoulder: Secondary | ICD-10-CM | POA: Diagnosis not present
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
## 2023-09-04 ENCOUNTER — Encounter: Admitting: Physical Therapy

## 2023-09-04 ENCOUNTER — Telehealth: Payer: Self-pay | Admitting: Physical Therapy

## 2023-09-04 NOTE — Telephone Encounter (Signed)
 LVM for pt as she did not show for her PT appt.  Reminded of next scheduled appt and asked to cancel if unable to attend.  Clarita Crane, PT, DPT 09/04/23 2:08 PM

## 2023-09-09 ENCOUNTER — Ambulatory Visit (INDEPENDENT_AMBULATORY_CARE_PROVIDER_SITE_OTHER): Admitting: Physical Therapy

## 2023-09-09 ENCOUNTER — Encounter: Payer: Self-pay | Admitting: Physical Therapy

## 2023-09-09 DIAGNOSIS — M542 Cervicalgia: Secondary | ICD-10-CM | POA: Diagnosis not present

## 2023-09-09 DIAGNOSIS — M6281 Muscle weakness (generalized): Secondary | ICD-10-CM | POA: Diagnosis not present

## 2023-09-09 DIAGNOSIS — M25511 Pain in right shoulder: Secondary | ICD-10-CM
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
## 2024-04-06 ENCOUNTER — Encounter: Payer: Self-pay | Admitting: Radiology
